# Patient Record
Sex: Female | Born: 1975 | Race: Asian | Hispanic: No | Marital: Married | Smoking: Never smoker
Health system: Southern US, Community
[De-identification: ages and names within clinical notes are randomized; demographics above are authoritative.]

## PROBLEM LIST (undated history)

## (undated) DIAGNOSIS — L709 Acne, unspecified: Secondary | ICD-10-CM

## (undated) DIAGNOSIS — Z789 Other specified health status: Secondary | ICD-10-CM

## (undated) HISTORY — PX: NO PAST SURGERIES: SHX2092

---

## 1998-12-05 ENCOUNTER — Other Ambulatory Visit: Admission: RE | Admit: 1998-12-05 | Discharge: 1998-12-05 | Payer: Self-pay | Admitting: Obstetrics and Gynecology

## 2000-08-19 ENCOUNTER — Ambulatory Visit (HOSPITAL_BASED_OUTPATIENT_CLINIC_OR_DEPARTMENT_OTHER): Admission: RE | Admit: 2000-08-19 | Discharge: 2000-08-19 | Payer: Self-pay | Admitting: Plastic Surgery

## 2001-02-11 ENCOUNTER — Other Ambulatory Visit: Admission: RE | Admit: 2001-02-11 | Discharge: 2001-02-11 | Payer: Self-pay | Admitting: Obstetrics and Gynecology

## 2001-04-17 ENCOUNTER — Inpatient Hospital Stay (HOSPITAL_COMMUNITY): Admission: AD | Admit: 2001-04-17 | Discharge: 2001-04-17 | Payer: Self-pay | Admitting: Obstetrics and Gynecology

## 2001-08-21 ENCOUNTER — Inpatient Hospital Stay (HOSPITAL_COMMUNITY): Admission: AD | Admit: 2001-08-21 | Discharge: 2001-08-21 | Payer: Self-pay | Admitting: Obstetrics and Gynecology

## 2001-08-22 ENCOUNTER — Inpatient Hospital Stay (HOSPITAL_COMMUNITY): Admission: AD | Admit: 2001-08-22 | Discharge: 2001-08-25 | Payer: Self-pay | Admitting: Obstetrics and Gynecology

## 2001-10-02 ENCOUNTER — Other Ambulatory Visit: Admission: RE | Admit: 2001-10-02 | Discharge: 2001-10-02 | Payer: Self-pay | Admitting: Obstetrics and Gynecology

## 2002-10-13 ENCOUNTER — Other Ambulatory Visit: Admission: RE | Admit: 2002-10-13 | Discharge: 2002-10-13 | Payer: Self-pay | Admitting: Obstetrics and Gynecology

## 2003-11-21 ENCOUNTER — Other Ambulatory Visit: Admission: RE | Admit: 2003-11-21 | Discharge: 2003-11-21 | Payer: Self-pay | Admitting: Obstetrics and Gynecology

## 2014-11-15 NOTE — H&P (Signed)
Cathy Yoder  DICTATION # [id number] CSN# [id number]   Margarette Asal, MD 11/15/2014 9:16 AM

## 2014-11-16 NOTE — H&P (Signed)
Cathy Yoder, Cathy Yoder NO.:  [id number]  MEDICAL RECORD NO.:  [id number]  LOCATION:                                FACILITY:  [facility]  PHYSICIAN:  Ralene Bathe. Matthew Saras, M.D.DATE OF BIRTH:  14-May-1975  DATE OF ADMISSION:  12/08/2014 DATE OF DISCHARGE:                             HISTORY & PHYSICAL   CHIEF COMPLAINT:  Symptomatic leiomyoma.  HPI:  A 39 year old, G3, P2.  Her husband has had a vasectomy, presents at this time for TAH bilateral salpingectomy for symptomatic fibroids. Over the last 6 months, she has had worsening problems related to pressure, menorrhagia, and lower back pain.  Ultrasound performed on July 16, demonstrated a large posterior fibroid 9 x 7 cm.  Due to the progressive nature of her symptoms, she prefers to proceed with definitive treatment in the form of TAH bilateral salpingectomy.  This procedure including specific risks related to bleeding, infection, adjacent organ injury, transfusion, wound infection, phlebitis, along with her expected recovery time reviewed.  The rationale for bilateral salpingectomy to reduce later life risks of ovarian cancer discussed.  PAST MEDICAL HISTORY:  Allergies:  AMPICILLIN.  FAMILY HISTORY:  Significant for thyroid disease, otherwise negative. She has had 2 vaginal deliveries.  SOCIAL HISTORY:  Denies alcohol, tobacco, or drug use.  She is married. Her last Pap, dated August 16,  was negative.  PHYSICAL EXAM:  VITAL SIGNS:  Temp 98.2, blood pressure 120/72. HEENT:  Unremarkable. NECK:  Supple without masses. LUNGS:  Clear. CARDIOVASCULAR:  Regular rate and rhythm without murmurs, rubs, gallops. BREASTS:  Without masses. ABDOMEN:  Soft, flat, nontender.  Vulva, vagina, cervix normal.  Uterus 12-14 week size.  Adnexa unremarkable. EXTREMITIES:  Unremarkable. NEUROLOGIC:  Unremarkable.  IMPRESSION:  Symptomatic leiomyoma.  PLAN:  TAH bilateral salpingectomy.  Procedure and risks reviewed  as above.     Kamen Hanken M. Matthew Saras, M.D.     [facility]/MEDQ  D:  11/15/2014  T:  11/16/2014  Job:  [id number]

## 2014-11-28 NOTE — Patient Instructions (Addendum)
Your procedure is scheduled on:12/08/14  Enter through the Main Entrance at :[facility] up desk phone and dial [phone] and inform us of your arrival.  Please call [phone] if you have any problems the morning of surgery.  Remember: Do not eat food or drink liquids, including water, after midnight:Wed.    You may brush your teeth the morning of surgery  DO NOT wear jewelry, eye make-up, lipstick,body lotion, or dark fingernail polish.  (Polished toes are ok) You may wear deodorant.  If you are to be admitted after surgery, leave suitcase in car until your room has been assigned. Patients discharged on the day of surgery will not be allowed to drive home. Wear loose fitting, comfortable clothes for your ride home.

## 2014-11-29 ENCOUNTER — Encounter (HOSPITAL_COMMUNITY): Payer: Self-pay

## 2014-11-29 ENCOUNTER — Encounter (HOSPITAL_COMMUNITY)
Admission: RE | Admit: 2014-11-29 | Discharge: 2014-11-29 | Disposition: A | Discharge status: Home / Self Care | Payer: 59 | Source: Ambulatory Visit | Attending: Obstetrics and Gynecology | Admitting: Obstetrics and Gynecology

## 2014-11-29 DIAGNOSIS — N946 Dysmenorrhea, unspecified: Secondary | ICD-10-CM | POA: Insufficient documentation

## 2014-11-29 DIAGNOSIS — Z01818 Encounter for other preprocedural examination: Secondary | ICD-10-CM | POA: Insufficient documentation

## 2014-11-29 DIAGNOSIS — D259 Leiomyoma of uterus, unspecified: Secondary | ICD-10-CM | POA: Insufficient documentation

## 2014-11-29 DIAGNOSIS — N920 Excessive and frequent menstruation with regular cycle: Secondary | ICD-10-CM | POA: Insufficient documentation

## 2014-11-29 HISTORY — DX: Other specified health status: Z78.9

## 2014-11-29 LAB — CBC
HEMATOCRIT: 36.3 % (ref 36.0–46.0)
Hemoglobin: 11.9 g/dL — ABNORMAL LOW (ref 12.0–15.0)
MCH: 30.6 pg (ref 26.0–34.0)
MCHC: 32.8 g/dL (ref 30.0–36.0)
MCV: 93.3 fL (ref 78.0–100.0)
PLATELETS: 218 10*3/uL (ref 150–400)
RBC: 3.89 MIL/uL (ref 3.87–5.11)
RDW: 13.4 % (ref 11.5–15.5)
WBC: 5.8 10*3/uL (ref 4.0–10.5)

## 2014-11-29 LAB — ABO/RH: ABO/RH(D): AB POS

## 2014-11-29 LAB — TYPE AND SCREEN
ABO/RH(D): AB POS
ANTIBODY SCREEN: NEGATIVE

## 2014-12-07 NOTE — Anesthesia Preprocedure Evaluation (Addendum)
Anesthesia Evaluation  Patient identified by MRN, date of birth, ID band Patient awake    Reviewed: Allergy & Precautions, NPO status , Patient's Chart, lab work & pertinent test results  Airway Mallampati: I  TM Distance: >3 FB Neck ROM: Full    Dental no notable dental hx. (+) Teeth Intact   Pulmonary neg pulmonary ROS,    Pulmonary exam normal breath sounds clear to auscultation       Cardiovascular negative cardio ROS Normal cardiovascular exam Rhythm:Regular Rate:Normal     Neuro/Psych negative neurological ROS  negative psych ROS   GI/Hepatic negative GI ROS, Neg liver ROS,   Endo/Other  negative endocrine ROS  Renal/GU negative Renal ROS  negative genitourinary   Musculoskeletal negative musculoskeletal ROS (+)   Abdominal   Peds  Hematology negative hematology ROS (+)   Anesthesia Other Findings   Reproductive/Obstetrics Uterine fibroids Menorrhagia Dysmenorrhea                             Anesthesia Physical Anesthesia Plan  ASA: I  Anesthesia Plan: General   Post-op Pain Management:    Induction: Intravenous  Airway Management Planned: Oral ETT  Additional Equipment:   Intra-op Plan:   Post-operative Plan: Extubation in OR  Informed Consent: I have reviewed the patients History and Physical, chart, labs and discussed the procedure including the risks, benefits and alternatives for the proposed anesthesia with the patient or authorized representative who has indicated his/her understanding and acceptance.   Dental advisory given  Plan Discussed with: CRNA, Anesthesiologist and Surgeon  Anesthesia Plan Comments:        Anesthesia Quick Evaluation

## 2014-12-08 ENCOUNTER — Inpatient Hospital Stay (HOSPITAL_COMMUNITY): Payer: 59 | Admitting: Anesthesiology

## 2014-12-08 ENCOUNTER — Encounter (HOSPITAL_COMMUNITY)
Admission: RE | Disposition: A | Discharge status: Home / Self Care | Payer: Self-pay | Source: Ambulatory Visit | Attending: Obstetrics and Gynecology

## 2014-12-08 ENCOUNTER — Inpatient Hospital Stay (HOSPITAL_COMMUNITY)
Admission: RE | Admit: 2014-12-08 | Discharge: 2014-12-09 | DRG: 743 | Disposition: A | Discharge status: Home / Self Care | Payer: 59 | Source: Ambulatory Visit | Attending: Obstetrics and Gynecology | Admitting: Obstetrics and Gynecology

## 2014-12-08 ENCOUNTER — Encounter (HOSPITAL_COMMUNITY): Payer: Self-pay | Admitting: *Deleted

## 2014-12-08 DIAGNOSIS — Z90710 Acquired absence of both cervix and uterus: Secondary | ICD-10-CM | POA: Diagnosis present

## 2014-12-08 DIAGNOSIS — N920 Excessive and frequent menstruation with regular cycle: Secondary | ICD-10-CM | POA: Diagnosis present

## 2014-12-08 DIAGNOSIS — M545 Low back pain: Secondary | ICD-10-CM | POA: Diagnosis present

## 2014-12-08 DIAGNOSIS — R102 Pelvic and perineal pain: Secondary | ICD-10-CM | POA: Diagnosis present

## 2014-12-08 DIAGNOSIS — N946 Dysmenorrhea, unspecified: Secondary | ICD-10-CM | POA: Diagnosis present

## 2014-12-08 DIAGNOSIS — D219 Benign neoplasm of connective and other soft tissue, unspecified: Secondary | ICD-10-CM | POA: Diagnosis present

## 2014-12-08 DIAGNOSIS — D259 Leiomyoma of uterus, unspecified: Secondary | ICD-10-CM | POA: Diagnosis present

## 2014-12-08 HISTORY — PX: BILATERAL SALPINGECTOMY: SHX5743

## 2014-12-08 HISTORY — PX: ABDOMINAL HYSTERECTOMY: SHX81

## 2014-12-08 LAB — PREGNANCY, URINE: PREG TEST UR: NEGATIVE

## 2014-12-08 SURGERY — HYSTERECTOMY, ABDOMINAL
Anesthesia: General

## 2014-12-08 MED ORDER — SCOPOLAMINE 1 MG/3DAYS TD PT72
MEDICATED_PATCH | TRANSDERMAL | Status: AC
  Administered 2014-12-08: 1.5 mg via TRANSDERMAL
  Filled 2014-12-08: qty 1

## 2014-12-08 MED ORDER — PHENYLEPHRINE HCL 10 MG/ML IJ SOLN
INTRAMUSCULAR | Status: DC | PRN
  Administered 2014-12-08 (×3): 40 ug via INTRAVENOUS

## 2014-12-08 MED ORDER — SODIUM CHLORIDE 0.9 % IJ SOLN
INTRAMUSCULAR | Status: AC
  Filled 2014-12-08: qty 20

## 2014-12-08 MED ORDER — MEPERIDINE HCL 25 MG/ML IJ SOLN: 6.2500 mg | INTRAMUSCULAR | Status: DC | PRN

## 2014-12-08 MED ORDER — ROCURONIUM BROMIDE 100 MG/10ML IV SOLN
INTRAVENOUS | Status: DC | PRN
  Administered 2014-12-08: 45 mg via INTRAVENOUS
  Administered 2014-12-08: 5 mg via INTRAVENOUS

## 2014-12-08 MED ORDER — ONDANSETRON HCL 4 MG/2ML IJ SOLN
INTRAMUSCULAR | Status: DC | PRN
  Administered 2014-12-08: 4 mg via INTRAVENOUS

## 2014-12-08 MED ORDER — KETOROLAC TROMETHAMINE 30 MG/ML IJ SOLN: 30.0000 mg | Freq: Once | INTRAMUSCULAR | Status: DC

## 2014-12-08 MED ORDER — IBUPROFEN 800 MG PO TABS
800.0000 mg | ORAL_TABLET | Freq: Three times a day (TID) | ORAL | Status: DC | PRN
  Administered 2014-12-09: 800 mg via ORAL
  Filled 2014-12-08: qty 1

## 2014-12-08 MED ORDER — SCOPOLAMINE 1 MG/3DAYS TD PT72
1.0000 | MEDICATED_PATCH | Freq: Once | TRANSDERMAL | Status: DC
  Administered 2014-12-08: 1.5 mg via TRANSDERMAL

## 2014-12-08 MED ORDER — KETOROLAC TROMETHAMINE 30 MG/ML IJ SOLN
30.0000 mg | Freq: Four times a day (QID) | INTRAMUSCULAR | Status: DC
  Administered 2014-12-08 – 2014-12-09 (×3): 30 mg via INTRAVENOUS
  Filled 2014-12-08 (×3): qty 1

## 2014-12-08 MED ORDER — NEOSTIGMINE METHYLSULFATE 10 MG/10ML IV SOLN
INTRAVENOUS | Status: AC
  Filled 2014-12-08: qty 1

## 2014-12-08 MED ORDER — KETOROLAC TROMETHAMINE 30 MG/ML IJ SOLN: 30.0000 mg | Freq: Four times a day (QID) | INTRAMUSCULAR | Status: DC

## 2014-12-08 MED ORDER — OXYCODONE-ACETAMINOPHEN 5-325 MG PO TABS
1.0000 | ORAL_TABLET | ORAL | Status: DC | PRN
  Administered 2014-12-09: 1 via ORAL
  Filled 2014-12-08: qty 1

## 2014-12-08 MED ORDER — NEOSTIGMINE METHYLSULFATE 10 MG/10ML IV SOLN
INTRAVENOUS | Status: DC | PRN
  Administered 2014-12-08: 2 mg via INTRAVENOUS

## 2014-12-08 MED ORDER — MENTHOL 3 MG MT LOZG: 1.0000 | LOZENGE | OROMUCOSAL | Status: DC | PRN

## 2014-12-08 MED ORDER — ONDANSETRON HCL 4 MG/2ML IJ SOLN
4.0000 mg | Freq: Four times a day (QID) | INTRAMUSCULAR | Status: DC | PRN
  Administered 2014-12-08: 4 mg via INTRAVENOUS

## 2014-12-08 MED ORDER — FENTANYL CITRATE (PF) 250 MCG/5ML IJ SOLN
INTRAMUSCULAR | Status: AC
  Filled 2014-12-08: qty 25

## 2014-12-08 MED ORDER — ONDANSETRON HCL 4 MG PO TABS: 4.0000 mg | ORAL_TABLET | Freq: Four times a day (QID) | ORAL | Status: DC | PRN

## 2014-12-08 MED ORDER — GLYCOPYRROLATE 0.2 MG/ML IJ SOLN
INTRAMUSCULAR | Status: DC | PRN
  Administered 2014-12-08: 0.4 mg via INTRAVENOUS

## 2014-12-08 MED ORDER — MIDAZOLAM HCL 2 MG/2ML IJ SOLN
INTRAMUSCULAR | Status: DC | PRN
  Administered 2014-12-08: 2 mg via INTRAVENOUS

## 2014-12-08 MED ORDER — PROPOFOL 10 MG/ML IV BOLUS
INTRAVENOUS | Status: AC
  Filled 2014-12-08: qty 20

## 2014-12-08 MED ORDER — DEXAMETHASONE SODIUM PHOSPHATE 10 MG/ML IJ SOLN
INTRAMUSCULAR | Status: AC
  Filled 2014-12-08: qty 1

## 2014-12-08 MED ORDER — GLYCOPYRROLATE 0.2 MG/ML IJ SOLN
INTRAMUSCULAR | Status: AC
  Filled 2014-12-08: qty 3

## 2014-12-08 MED ORDER — HYDROMORPHONE HCL 1 MG/ML IJ SOLN
INTRAMUSCULAR | Status: AC
  Filled 2014-12-08: qty 1

## 2014-12-08 MED ORDER — DEXTROSE IN LACTATED RINGERS 5 % IV SOLN
INTRAVENOUS | Status: DC
  Administered 2014-12-08: 17:00:00 via INTRAVENOUS

## 2014-12-08 MED ORDER — BUPIVACAINE LIPOSOME 1.3 % IJ SUSP
20.0000 mL | Freq: Once | INTRAMUSCULAR | Status: AC
  Administered 2014-12-08: 40 mL
  Filled 2014-12-08: qty 20

## 2014-12-08 MED ORDER — NALOXONE HCL 0.4 MG/ML IJ SOLN: 0.4000 mg | INTRAMUSCULAR | Status: DC | PRN

## 2014-12-08 MED ORDER — FENTANYL CITRATE (PF) 100 MCG/2ML IJ SOLN
INTRAMUSCULAR | Status: DC | PRN
  Administered 2014-12-08: 100 ug via INTRAVENOUS
  Administered 2014-12-08 (×3): 50 ug via INTRAVENOUS

## 2014-12-08 MED ORDER — LACTATED RINGERS IV SOLN
INTRAVENOUS | Status: DC
  Administered 2014-12-08 (×4): via INTRAVENOUS

## 2014-12-08 MED ORDER — MIDAZOLAM HCL 2 MG/2ML IJ SOLN
INTRAMUSCULAR | Status: AC
  Filled 2014-12-08: qty 4

## 2014-12-08 MED ORDER — LIDOCAINE HCL (CARDIAC) 20 MG/ML IV SOLN
INTRAVENOUS | Status: DC | PRN
  Administered 2014-12-08: 80 mg via INTRAVENOUS

## 2014-12-08 MED ORDER — ROCURONIUM BROMIDE 100 MG/10ML IV SOLN
INTRAVENOUS | Status: AC
  Filled 2014-12-08: qty 1

## 2014-12-08 MED ORDER — HYDROMORPHONE HCL 1 MG/ML IJ SOLN
0.2500 mg | INTRAMUSCULAR | Status: DC | PRN
  Administered 2014-12-08 (×2): 0.5 mg via INTRAVENOUS

## 2014-12-08 MED ORDER — HYDROMORPHONE HCL 1 MG/ML IJ SOLN
INTRAMUSCULAR | Status: DC | PRN
  Administered 2014-12-08 (×2): 0.5 mg via INTRAVENOUS

## 2014-12-08 MED ORDER — BUTORPHANOL TARTRATE 1 MG/ML IJ SOLN: 1.0000 mg | INTRAMUSCULAR | Status: DC | PRN

## 2014-12-08 MED ORDER — PROPOFOL 10 MG/ML IV BOLUS
INTRAVENOUS | Status: DC | PRN
  Administered 2014-12-08: 160 mg via INTRAVENOUS

## 2014-12-08 MED ORDER — INFLUENZA VAC SPLIT QUAD 0.5 ML IM SUSY
0.5000 mL | PREFILLED_SYRINGE | INTRAMUSCULAR | Status: AC
  Administered 2014-12-09: 0.5 mL via INTRAMUSCULAR
  Filled 2014-12-08: qty 0.5

## 2014-12-08 MED ORDER — KETOROLAC TROMETHAMINE 30 MG/ML IJ SOLN
INTRAMUSCULAR | Status: DC | PRN
  Administered 2014-12-08: 30 mg via INTRAVENOUS

## 2014-12-08 MED ORDER — ONDANSETRON HCL 4 MG/2ML IJ SOLN
4.0000 mg | Freq: Four times a day (QID) | INTRAMUSCULAR | Status: DC | PRN
  Filled 2014-12-08: qty 2

## 2014-12-08 MED ORDER — ONDANSETRON HCL 4 MG/2ML IJ SOLN
INTRAMUSCULAR | Status: AC
  Filled 2014-12-08: qty 2

## 2014-12-08 MED ORDER — LIDOCAINE HCL (CARDIAC) 20 MG/ML IV SOLN
INTRAVENOUS | Status: AC
  Filled 2014-12-08: qty 5

## 2014-12-08 MED ORDER — PHENYLEPHRINE HCL 10 MG/ML IJ SOLN: INTRAMUSCULAR | Status: DC | PRN

## 2014-12-08 MED ORDER — METOCLOPRAMIDE HCL 5 MG/ML IJ SOLN: 10.0000 mg | Freq: Once | INTRAMUSCULAR | Status: DC | PRN

## 2014-12-08 MED ORDER — CEFAZOLIN SODIUM-DEXTROSE 2-3 GM-% IV SOLR
INTRAVENOUS | Status: AC
  Filled 2014-12-08: qty 50

## 2014-12-08 MED ORDER — CEFAZOLIN SODIUM-DEXTROSE 2-3 GM-% IV SOLR
2.0000 g | Freq: Once | INTRAVENOUS | Status: AC
  Administered 2014-12-08: 2 g via INTRAVENOUS

## 2014-12-08 MED ORDER — SODIUM CHLORIDE 0.9 % IJ SOLN: 9.0000 mL | INTRAMUSCULAR | Status: DC | PRN

## 2014-12-08 MED ORDER — DEXTROSE 5 % IV SOLN
INTRAVENOUS | Status: DC
  Filled 2014-12-08: qty 8.5

## 2014-12-08 MED ORDER — MORPHINE SULFATE 1 MG/ML IV SOLN
INTRAVENOUS | Status: DC
  Administered 2014-12-08: 11:00:00 via INTRAVENOUS
  Administered 2014-12-08: 2 mg via INTRAVENOUS
  Administered 2014-12-08 (×2): 5 mg via INTRAVENOUS
  Administered 2014-12-09: 1 mg via INTRAVENOUS
  Filled 2014-12-08: qty 25

## 2014-12-08 MED ORDER — DIPHENHYDRAMINE HCL 50 MG/ML IJ SOLN: 12.5000 mg | Freq: Four times a day (QID) | INTRAMUSCULAR | Status: DC | PRN

## 2014-12-08 MED ORDER — DIPHENHYDRAMINE HCL 12.5 MG/5ML PO ELIX: 12.5000 mg | ORAL_SOLUTION | Freq: Four times a day (QID) | ORAL | Status: DC | PRN

## 2014-12-08 MED ORDER — DEXAMETHASONE SODIUM PHOSPHATE 10 MG/ML IJ SOLN
INTRAMUSCULAR | Status: DC | PRN
  Administered 2014-12-08: 4 mg via INTRAVENOUS

## 2014-12-08 SURGICAL SUPPLY — 43 items
BARRIER ADHS 3X4 INTERCEED (GAUZE/BANDAGES/DRESSINGS) IMPLANT
BENZOIN TINCTURE PRP APPL 2/3 (GAUZE/BANDAGES/DRESSINGS) ×4 IMPLANT
CANISTER SUCT 3000ML (MISCELLANEOUS) ×4 IMPLANT
CLOSURE WOUND 1/2 X4 (GAUZE/BANDAGES/DRESSINGS) ×1
CLOTH BEACON ORANGE TIMEOUT ST (SAFETY) ×4 IMPLANT
CONT PATH 16OZ SNAP LID 3702 (MISCELLANEOUS) ×4 IMPLANT
DECANTER SPIKE VIAL GLASS SM (MISCELLANEOUS) ×8 IMPLANT
DRAPE CESAREAN BIRTH W POUCH (DRAPES) ×4 IMPLANT
DRAPE WARM FLUID 44X44 (DRAPE) ×4 IMPLANT
DRSG OPSITE POSTOP 4X10 (GAUZE/BANDAGES/DRESSINGS) ×4 IMPLANT
DURAPREP 26ML APPLICATOR (WOUND CARE) ×4 IMPLANT
ELECT LIGASURE SHORT 9 REUSE (ELECTRODE) ×4 IMPLANT
GLOVE BIO SURGEON STRL SZ7 (GLOVE) ×12 IMPLANT
GLOVE BIOGEL PI IND STRL 7.0 (GLOVE) ×6 IMPLANT
GLOVE BIOGEL PI INDICATOR 7.0 (GLOVE) ×6
GOWN STRL REUS W/TWL LRG LVL3 (GOWN DISPOSABLE) ×12 IMPLANT
NEEDLE HYPO 18GX1.5 BLUNT FILL (NEEDLE) IMPLANT
NEEDLE HYPO 22GX1.5 SAFETY (NEEDLE) ×8 IMPLANT
NEEDLE HYPO 25X1 1.5 SAFETY (NEEDLE) ×4 IMPLANT
NS IRRIG 1000ML POUR BTL (IV SOLUTION) ×4 IMPLANT
PACK ABDOMINAL GYN (CUSTOM PROCEDURE TRAY) ×4 IMPLANT
PAD OB MATERNITY 4.3X12.25 (PERSONAL CARE ITEMS) ×4 IMPLANT
PENCIL SMOKE EVAC W/HOLSTER (ELECTROSURGICAL) ×4 IMPLANT
PROTECTOR NERVE ULNAR (MISCELLANEOUS) ×4 IMPLANT
SPONGE LAP 18X18 X RAY DECT (DISPOSABLE) ×8 IMPLANT
STRIP CLOSURE SKIN 1/2X4 (GAUZE/BANDAGES/DRESSINGS) ×3 IMPLANT
SUT CHROMIC 3 0 SH 27 (SUTURE) IMPLANT
SUT MON AB 2-0 CT1 36 (SUTURE) ×4 IMPLANT
SUT MON AB 4-0 PS1 27 (SUTURE) ×4 IMPLANT
SUT PDS AB 0 CT1 27 (SUTURE) ×8 IMPLANT
SUT VIC AB 0 CT1 18XCR BRD8 (SUTURE) ×6 IMPLANT
SUT VIC AB 0 CT1 27 (SUTURE) ×2
SUT VIC AB 0 CT1 27XBRD ANBCTR (SUTURE) ×2 IMPLANT
SUT VIC AB 0 CT1 8-18 (SUTURE) ×6
SUT VIC AB 2-0 CT1 27 (SUTURE)
SUT VIC AB 2-0 CT1 TAPERPNT 27 (SUTURE) IMPLANT
SUT VIC AB 3-0 CT1 27 (SUTURE) ×2
SUT VIC AB 3-0 CT1 TAPERPNT 27 (SUTURE) ×2 IMPLANT
SUT VICRYL 0 TIES 12 18 (SUTURE) ×4 IMPLANT
SYR 20CC LL (SYRINGE) ×8 IMPLANT
TOWEL OR 17X24 6PK STRL BLUE (TOWEL DISPOSABLE) ×8 IMPLANT
TRAY FOLEY CATH SILVER 14FR (SET/KITS/TRAYS/PACK) ×4 IMPLANT
WATER STERILE IRR 1000ML POUR (IV SOLUTION) ×4 IMPLANT

## 2014-12-08 NOTE — Addendum Note (Signed)
Addendum  created 12/08/14 1325 by Raenette Rover, CRNA   Modules edited: Notes Section   Notes Section:  File: [id number]

## 2014-12-08 NOTE — Progress Notes (Signed)
The patient was re-examined with no change in status 

## 2014-12-08 NOTE — Anesthesia Procedure Notes (Signed)
Procedure Name: Intubation Date/Time: 12/08/2014 7:28 AM Performed by: Flossie Dibble Pre-anesthesia Checklist: Suction available, Emergency Drugs available, Timeout performed, Patient identified and Patient being monitored Patient Re-evaluated:Patient Re-evaluated prior to inductionOxygen Delivery Method: Circle system utilized Preoxygenation: Pre-oxygenation with 100% oxygen Intubation Type: IV induction Ventilation: Mask ventilation without difficulty Laryngoscope Size: Mac and 3 Grade View: Grade I Tube type: Oral Tube size: 7.0 mm Number of attempts: 1 Airway Equipment and Method: Stylet Placement Confirmation: ETT inserted through vocal cords under direct vision,  positive ETCO2 and breath sounds checked- equal and bilateral Secured at: 21 cm Tube secured with: Tape Dental Injury: Teeth and Oropharynx as per pre-operative assessment

## 2014-12-08 NOTE — Anesthesia Postprocedure Evaluation (Signed)
  Anesthesia Post-op Note  Patient: Cathy Yoder  Procedure(s) Performed: Procedure(s): HYSTERECTOMY ABDOMINAL (N/A) BILATERAL SALPINGECTOMY (Bilateral)  Patient Location: PACU  Anesthesia Type:General  Level of Consciousness: awake, alert  and oriented  Airway and Oxygen Therapy: Patient Spontanous Breathing and Patient connected to nasal cannula oxygen  Post-op Pain: mild  Post-op Assessment: Post-op Vital signs reviewed, Patient's Cardiovascular Status Stable, Respiratory Function Stable, Patent Airway, No signs of Nausea or vomiting, Pain level controlled and No headache              Post-op Vital Signs: Reviewed and stable  Last Vitals:  Filed Vitals:   12/08/14 0850  BP: 101/54  Pulse: 64  Temp: 36.9 C  Resp: 10    Complications: No apparent anesthesia complications

## 2014-12-08 NOTE — Op Note (Signed)
Preoperative diagnosis: Symptomatic leiomyoma  Postoperative diagnosis: Same  Procedure: TAH, bilateral salpingectomy  Surgeon: Matthew Saras  Asst.: McComb  EBL: 1 50 cc  Specimens removed: Uterus, bilateral tubes, to pathology  Drains: Foley, to straight drain  Procedure and findings:  The patient was taken the operating room after an adequate level of general anesthesia was obtained with the patient supine the abdomen prepped and draped in the usual fashion, vagina was prepped separately and Foley catheter positioned. Appropriate timeouts taken at that point. Transverse incision made 2 finger breaths above the symphysis carried down to the fascia which was incised and extended transversely. Rectus muscles divided in the midline, peritoneum entered superiorly without incident and extended vertically. The uterus is enlarged approximately 12 weeks size was able to be delivered through the incision without a retractor. Long Kelly clamps were then placed at each utero-ovarian pedicle bilateral tubes and ovaries were normal.  Starting on the left the round ligament was clamped divided and suture ligated with oh Vicryls peritoneum carried around to the midline posterior leaf of the broad ligament was perforated with the surgeon's finger and the utero-ovarian pedicle was isolated clamped divided first free tie followed by suture ligature oh Vicryls thus conserving the ovary the exact same repeated on the opposite side once the peritoneum was advanced medially, the bladder flap was created with sharp and blunt dissection assuring that the bladder was well below the cervical vaginal junction. Uterine vasculature pedicles on either side were then skeletonized, using clamp cut and tie technique the descending branch of the uterine artery, cardinal ligament uterosacral ligament and cervical vaginal pedicles were clamped divided and suture ligated. The specimen was excised vaginal cuff closed with interrupted  figure-of-eight 0 Vicryls sutures. The pelvis is in irrigated with saline and noted be hemostatic. The LigaSure device was then used to perform bilateral salpingectomy removing both tubes but conserving both ovaries.  Careful inspection the operative site revealed to be hemostatic. Sponge, needle, instrument counts reported as correct 2 peritoneum was enclose with a 30 Vicryls running suture. The same to reapproximate rectus muscles in the midline. 0 PDS was then used from laterally to midline on either side to close the fascia subcutaneous dictated tissue was hemostatic. 50% diluted Exparel solution was then used to inject subfascially and subcutaneously for postoperative analgesia. 4-0 Monocryl subcuticular closure with Steri-Strips. She tolerated this well went to recovery room in good condition.  Dictated with OrwellD.

## 2014-12-08 NOTE — Transfer of Care (Signed)
Immediate Anesthesia Transfer of Care Note  Patient: Cathy Yoder  Procedure(s) Performed: Procedure(s): HYSTERECTOMY ABDOMINAL (N/A) BILATERAL SALPINGECTOMY (Bilateral)  Patient Location: PACU  Anesthesia Type:General  Level of Consciousness: awake, alert  and oriented  Airway & Oxygen Therapy: Patient Spontanous Breathing and Patient connected to nasal cannula oxygen  Post-op Assessment: Report given to RN and Post -op Vital signs reviewed and stable  Post vital signs: Reviewed and stable  Last Vitals:  Filed Vitals:   12/08/14 0612  BP: 114/81  Pulse: 71  Temp: 36.8 C  Resp: 18    Complications: No apparent anesthesia complications

## 2014-12-08 NOTE — Anesthesia Postprocedure Evaluation (Signed)
  Anesthesia Post-op Note  Patient: Cathy Yoder  Procedure(s) Performed: Procedure(s): HYSTERECTOMY ABDOMINAL (N/A) BILATERAL SALPINGECTOMY (Bilateral)  Patient Location: [facility]  Anesthesia Type:General  Level of Consciousness: awake, alert , oriented and patient cooperative  Airway and Oxygen Therapy: Patient Spontanous Breathing and Patient connected to nasal cannula oxygen  Post-op Pain: none  Post-op Assessment: Post-op Vital signs reviewed, Patient's Cardiovascular Status Stable, Respiratory Function Stable, Patent Airway and No signs of Nausea or vomiting              Post-op Vital Signs: Reviewed and stable  Last Vitals:  Filed Vitals:   12/08/14 1200  BP: 108/76  Pulse: 86  Temp: 37.1 C  Resp: 12    Complications: No apparent anesthesia complications

## 2014-12-09 ENCOUNTER — Encounter (HOSPITAL_COMMUNITY): Payer: Self-pay | Admitting: Obstetrics and Gynecology

## 2014-12-09 LAB — CBC
HEMATOCRIT: 27.8 % — AB (ref 36.0–46.0)
HEMOGLOBIN: 9.3 g/dL — AB (ref 12.0–15.0)
MCH: 30.5 pg (ref 26.0–34.0)
MCHC: 33.5 g/dL (ref 30.0–36.0)
MCV: 91.1 fL (ref 78.0–100.0)
Platelets: 185 10*3/uL (ref 150–400)
RBC: 3.05 MIL/uL — AB (ref 3.87–5.11)
RDW: 13.1 % (ref 11.5–15.5)
WBC: 6.7 10*3/uL (ref 4.0–10.5)

## 2014-12-09 MED ORDER — IBUPROFEN 800 MG PO TABS: 800.0000 mg | ORAL_TABLET | Freq: Three times a day (TID) | ORAL | Status: DC | PRN

## 2014-12-09 MED ORDER — OXYCODONE-ACETAMINOPHEN 5-325 MG PO TABS: 1.0000 | ORAL_TABLET | ORAL | Status: DC | PRN

## 2014-12-09 NOTE — Progress Notes (Signed)
Patient discharged home with mother... Discharge instructions reviewed with patient and she verbalized understanding... Honeycomb removed prior to discharge, and steri strips left in place... Condition stable... No equipment... Taken to car via wheelchair by Janalyn Shy, RN.

## 2014-12-09 NOTE — Discharge Summary (Signed)
Physician Discharge Summary  Patient ID: Cathy Yoder MRN: [id number] DOB/AGE: 41-Dec-1977 41 y.o.  Admit date: 12/08/2014 Discharge date: 12/09/2014  Admission Diagnoses:leiomyoma/menorrhagia/pelvic pain  Discharge Diagnoses: same Active Problems:   Leiomyoma   H/O abdominal hysterectomy   Discharged Condition: good  Hospital Course: adm for TAH + bilat salpingectomy, on POD 1, afeb, tol PO, rerady for D/C  Consults: None  Significant Diagnostic Studies: labs:  CBC    Component Value Date/Time   WBC 6.7 12/09/2014 0600   RBC 3.05* 12/09/2014 0600   HGB 9.3* 12/09/2014 0600   HCT 27.8* 12/09/2014 0600   PLT 185 12/09/2014 0600   MCV 91.1 12/09/2014 0600   MCH 30.5 12/09/2014 0600   MCHC 33.5 12/09/2014 0600   RDW 13.1 12/09/2014 0600      Treatments: surgery: TAH bilat salpingectomy  Discharge Exam: Blood pressure 87/51, pulse 60, temperature 98.9 F (37.2 C), temperature source Oral, resp. rate 15, height 5\' 5"  (1.651 m), weight 151 lb (68.493 kg), SpO2 98 %. Incision/Wound:c/d, abd soft + BS  Disposition:      Medication List    TAKE these medications        ibuprofen 800 MG tablet  Commonly known as:  ADVIL,MOTRIN  Take 1 tablet (800 mg total) by mouth every 8 (eight) hours as needed for moderate pain (mild pain).     oxyCODONE-acetaminophen 5-325 MG tablet  Commonly known as:  PERCOCET/ROXICET  Take 1-2 tablets by mouth every 4 (four) hours as needed for severe pain (moderate to severe pain (when tolerating fluids)).           Follow-up Information    Follow up with Margarette Asal, MD.   Specialty:  Obstetrics and Gynecology   Why:  office will call   Contact information:   [address] [place] [address] [phone]       Signed: Margarette Asal 12/09/2014, 8:42 AM

## 2018-08-31 ENCOUNTER — Other Ambulatory Visit: Payer: Self-pay | Admitting: Obstetrics and Gynecology

## 2018-08-31 DIAGNOSIS — N6002 Solitary cyst of left breast: Secondary | ICD-10-CM

## 2018-09-07 ENCOUNTER — Ambulatory Visit
Admission: RE | Admit: 2018-09-07 | Discharge: 2018-09-07 | Disposition: A | Discharge status: Home / Self Care | Payer: 59 | Source: Ambulatory Visit | Attending: Obstetrics and Gynecology | Admitting: Obstetrics and Gynecology

## 2018-09-07 ENCOUNTER — Other Ambulatory Visit: Payer: Self-pay | Admitting: Obstetrics and Gynecology

## 2018-09-07 ENCOUNTER — Other Ambulatory Visit: Payer: Self-pay

## 2018-09-07 DIAGNOSIS — N6002 Solitary cyst of left breast: Secondary | ICD-10-CM

## 2018-09-07 DIAGNOSIS — N6320 Unspecified lump in the left breast, unspecified quadrant: Secondary | ICD-10-CM

## 2018-09-08 ENCOUNTER — Ambulatory Visit
Admission: RE | Admit: 2018-09-08 | Discharge: 2018-09-08 | Disposition: A | Discharge status: Home / Self Care | Payer: 59 | Source: Ambulatory Visit | Attending: Obstetrics and Gynecology | Admitting: Obstetrics and Gynecology

## 2018-09-08 DIAGNOSIS — N6320 Unspecified lump in the left breast, unspecified quadrant: Secondary | ICD-10-CM

## 2018-09-18 ENCOUNTER — Ambulatory Visit: Payer: Self-pay | Admitting: Surgery

## 2018-09-18 DIAGNOSIS — D242 Benign neoplasm of left breast: Secondary | ICD-10-CM

## 2018-09-18 NOTE — H&P (Signed)
Cathy Yoder Documented: 09/18/2018 9:33 AM Location: [facility] Patient #: [id number] DOB: 03/26/1975 Married / Language: English / Race: Asian Female  History of Present Illness Marcello Moores A. Keyshla Tunison MD; 09/18/2018 11:57 AM) Patient words: Patient sent request of Dr.Hu for abnormal mammogram. She had an area in her left upper inner quadrant of her breast core biopsy proven to be a papillary lesion with no atypia. She has no family history. Denies breast pain, nipple discharge or breast mass bilaterally.                  Diagnosis Breast, left, needle core biopsy, 10 o'clock position, upper inner - PAPILLARY LESION. SEE NOTE Diagnosis Note The findings are suggestive of an intraductal papilloma. Immunohistochemical stains for E-cadherin and CK5/6 are negative for presence of neoplasia in the submitted biopsy. Dr. Melina Copa has reviewed the case and concurs with the above interpretation. [facility] was notified on 09/09/2018. Jaquita Folds MD Pathologist, Electronic Signature (Case signed 09/09/2018) Specimen Gross and Clinical Information   CLINICAL DATA: 43 year old female with new palpable LEFT breast lump identified on self-examination. Also for annual bilateral mammogram.  EXAM: DIGITAL DIAGNOSTIC BILATERAL MAMMOGRAM WITH CAD AND TOMO  ULTRASOUND LEFT BREAST  COMPARISON: Previous exam(s).  ACR Breast Density Category c: The breast tissue is heterogeneously dense, which may obscure small masses.  FINDINGS: 2D/3D full field views of both breasts and spot compression view of the LEFT breast demonstrate an obscured mass within the UPPER INNER LEFT breast at the site of palpable concern.  No other mass, distortion or worrisome calcifications are noted within either breast.  Mammographic images were processed with CAD.  On physical exam, a firm palpable mass identified at the 10 o'clock position of the LEFT breast  1 cm from the nipple.  Targeted ultrasound is performed, showing a 1.3 x 0.9 x 1.5 cm partially circumscribed partially indistinct hypoechoic mass at the 10 o'clock position of the LEFT breast 1 cm from the nipple with internal vascular flow. There is equivocal irregularity along the anterior aspect of this mass.  No abnormal LEFT axillary lymph nodes are identified.  IMPRESSION: Indeterminate 1.5 cm UPPER INNER LEFT breast mass. Tissue sampling is recommended.  No abnormal LEFT axillary lymph nodes.  RECOMMENDATION: Ultrasound-guided LEFT breast biopsy, which will be scheduled.  I have discussed the findings and recommendations with the patient. If applicable, a reminder letter will be sent to the patient regarding the next appointment.  BI-RADS CATEGORY 4: Suspicious.   Electronically Signed By: Margarette Canada M.D. On: 09/07/2018 13:38.  The patient is a 43 year old female.   Allergies (Tanisha A. Owens Shark, [place]; 09/18/2018 9:34 AM) No Known Drug Allergies [09/18/2018]: Allergies Reconciled  Medication History (Tanisha A. Owens Shark, [place]; 09/18/2018 9:34 AM) Spironolactone (100MG  Tablet, Oral) Active. Clindamycin Phosphate (1% Lotion, External) Active. Medications Reconciled    Vitals (Tanisha A. Brown RMA; 09/18/2018 9:34 AM) 09/18/2018 9:33 AM Weight: 138.4 lb Height: 64in Body Surface Area: 1.67 m Body Mass Index: 23.76 kg/m  Temp.: 98.42F  Pulse: 95 (Regular)  BP: 124/88 (Sitting, Left Arm, Standard)        Physical Exam (Mikayla Chiusano A. Kirsi Hugh MD; 09/18/2018 11:57 AM)  General Mental Status-Alert. General Appearance-Consistent with stated age. Hydration-Well hydrated. Voice-Normal.  Head and Neck Head-normocephalic, atraumatic with no lesions or palpable masses. Trachea-midline. Thyroid Gland Characteristics - normal size and consistency.  Eye Eyeball - Bilateral-Extraocular movements intact. Sclera/Conjunctiva -  Bilateral-No scleral icterus.  Chest and Lung Exam  Chest and lung exam reveals -quiet, even and easy respiratory effort with no use of accessory muscles and on auscultation, normal breath sounds, no adventitious sounds and normal vocal resonance. Inspection Chest Wall - Normal. Back - normal.  Breast Breast - Left-Symmetric, Non Tender, No Biopsy scars, no Dimpling - Left, No Inflammation, No Lumpectomy scars, No Mastectomy scars, No Peau d' Orange. Breast - Right-Symmetric, Non Tender, No Biopsy scars, no Dimpling - Right, No Inflammation, No Lumpectomy scars, No Mastectomy scars, No Peau d' Orange. Breast Lump-No Palpable Breast Mass. Note: Left breast shows bruising in the medial aspect.  Cardiovascular Cardiovascular examination reveals -normal heart sounds, regular rate and rhythm with no murmurs and normal pedal pulses bilaterally.  Neurologic Neurologic evaluation reveals -alert and oriented x 3 with no impairment of recent or remote memory. Mental Status-Normal.  Lymphatic Head & Neck  General Head & Neck Lymphatics: Bilateral - Description - Normal. Axillary  General Axillary Region: Bilateral - Description - Normal. Tenderness - Non Tender.    Assessment & Plan (Pepper Wyndham A. Tarvares Lant MD; 09/18/2018 11:58 AM)  INTRADUCTAL PAPILLOMA OF BREAST, LEFT (D24.2) Impression: Discussed lumpectomy versus observation. Discussed potential upgrade risk of 10%. She is opted for left breast seed Risk of lumpectomy include bleeding, infection, seroma, more surgery, use of seed/wire, wound care, cosmetic deformity and the need for other treatments, death , blood clots, death. Pt agrees to proceed. localized lumpectomy.  Current Plans Pt Education - [facility] Free Text Education/Instructions: discussed with patient and provided information. Pt Education - [facility] Breast Biopsy HCI: discussed with patient and provided information. The anatomy and the physiology was discussed. The  pathophysiology and natural history of the disease was discussed. Options were discussed and recommendations were made. Technique, risks, benefits, & alternatives were discussed. Risks such as stroke, heart attack, bleeding, indection, death, and other risks discussed. Questions answered. The patient agrees to proceed.

## 2018-09-24 ENCOUNTER — Other Ambulatory Visit: Payer: Self-pay | Admitting: Surgery

## 2018-09-24 DIAGNOSIS — D242 Benign neoplasm of left breast: Secondary | ICD-10-CM

## 2018-10-26 ENCOUNTER — Other Ambulatory Visit: Payer: Self-pay

## 2018-10-26 ENCOUNTER — Other Ambulatory Visit (HOSPITAL_COMMUNITY)
Admission: RE | Admit: 2018-10-26 | Discharge: 2018-10-26 | Disposition: A | Discharge status: Home / Self Care | Payer: 59 | Source: Ambulatory Visit | Attending: Surgery | Admitting: Surgery

## 2018-10-26 ENCOUNTER — Encounter (HOSPITAL_BASED_OUTPATIENT_CLINIC_OR_DEPARTMENT_OTHER): Payer: Self-pay | Admitting: *Deleted

## 2018-10-26 DIAGNOSIS — D242 Benign neoplasm of left breast: Secondary | ICD-10-CM | POA: Diagnosis not present

## 2018-10-26 DIAGNOSIS — Z01812 Encounter for preprocedural laboratory examination: Secondary | ICD-10-CM | POA: Diagnosis not present

## 2018-10-26 DIAGNOSIS — Z20828 Contact with and (suspected) exposure to other viral communicable diseases: Secondary | ICD-10-CM | POA: Diagnosis not present

## 2018-10-26 LAB — SARS CORONAVIRUS 2 (TAT 6-24 HRS): SARS Coronavirus 2: NEGATIVE

## 2018-10-27 ENCOUNTER — Encounter (HOSPITAL_BASED_OUTPATIENT_CLINIC_OR_DEPARTMENT_OTHER)
Admission: RE | Admit: 2018-10-27 | Discharge: 2018-10-27 | Disposition: A | Discharge status: Home / Self Care | Payer: 59 | Source: Ambulatory Visit | Attending: Surgery | Admitting: Surgery

## 2018-10-27 DIAGNOSIS — Z01812 Encounter for preprocedural laboratory examination: Secondary | ICD-10-CM | POA: Insufficient documentation

## 2018-10-27 DIAGNOSIS — D242 Benign neoplasm of left breast: Secondary | ICD-10-CM | POA: Diagnosis not present

## 2018-10-27 LAB — COMPREHENSIVE METABOLIC PANEL
ALT: 11 U/L (ref 0–44)
AST: 16 U/L (ref 15–41)
Albumin: 4 g/dL (ref 3.5–5.0)
Alkaline Phosphatase: 39 U/L (ref 38–126)
Anion gap: 8 (ref 5–15)
BUN: 6 mg/dL (ref 6–20)
CO2: 25 mmol/L (ref 22–32)
Calcium: 9.1 mg/dL (ref 8.9–10.3)
Chloride: 105 mmol/L (ref 98–111)
Creatinine, Ser: 0.69 mg/dL (ref 0.44–1.00)
GFR calc Af Amer: >60 mL/min (ref >60)
GFR calc non Af Amer: >60 mL/min (ref >60)
Glucose, Bld: 95 mg/dL (ref 70–99)
Potassium: 3.7 mmol/L (ref 3.5–5.1)
Sodium: 138 mmol/L (ref 135–145)
Total Bilirubin: 0.9 mg/dL (ref 0.3–1.2)
Total Protein: 6.9 g/dL (ref 6.5–8.1)

## 2018-10-27 LAB — CBC WITH DIFFERENTIAL/PLATELET
Abs Immature Granulocytes: 0.01 10*3/uL (ref 0.00–0.07)
Basophils Absolute: 0.1 10*3/uL (ref 0.0–0.1)
Basophils Relative: 2 %
Eosinophils Absolute: 0.2 10*3/uL (ref 0.0–0.5)
Eosinophils Relative: 3 %
HCT: 37.7 % (ref 36.0–46.0)
Hemoglobin: 12.2 g/dL (ref 12.0–15.0)
Immature Granulocytes: 0 %
Lymphocytes Relative: 32 %
Lymphs Abs: 1.6 10*3/uL (ref 0.7–4.0)
MCH: 31.4 pg (ref 26.0–34.0)
MCHC: 32.4 g/dL (ref 30.0–36.0)
MCV: 97.2 fL (ref 80.0–100.0)
Monocytes Absolute: 0.4 10*3/uL (ref 0.1–1.0)
Monocytes Relative: 8 %
Neutro Abs: 2.8 10*3/uL (ref 1.7–7.7)
Neutrophils Relative %: 55 %
Platelets: 284 10*3/uL (ref 150–400)
RBC: 3.88 MIL/uL (ref 3.87–5.11)
RDW: 12.2 % (ref 11.5–15.5)
WBC: 5 10*3/uL (ref 4.0–10.5)
nRBC: 0 % (ref 0.0–0.2)

## 2018-10-27 NOTE — Progress Notes (Signed)

## 2018-10-28 ENCOUNTER — Other Ambulatory Visit: Payer: Self-pay

## 2018-10-28 ENCOUNTER — Ambulatory Visit
Admission: RE | Admit: 2018-10-28 | Discharge: 2018-10-28 | Disposition: A | Discharge status: Home / Self Care | Payer: 59 | Source: Ambulatory Visit | Attending: Surgery | Admitting: Surgery

## 2018-10-28 DIAGNOSIS — D242 Benign neoplasm of left breast: Secondary | ICD-10-CM

## 2018-10-29 ENCOUNTER — Ambulatory Visit (HOSPITAL_BASED_OUTPATIENT_CLINIC_OR_DEPARTMENT_OTHER): Payer: 59 | Admitting: Certified Registered"

## 2018-10-29 ENCOUNTER — Encounter (HOSPITAL_BASED_OUTPATIENT_CLINIC_OR_DEPARTMENT_OTHER)
Admission: RE | Disposition: A | Discharge status: Home / Self Care | Payer: Self-pay | Source: Home / Self Care | Attending: Surgery

## 2018-10-29 ENCOUNTER — Other Ambulatory Visit: Payer: Self-pay

## 2018-10-29 ENCOUNTER — Ambulatory Visit
Admission: RE | Admit: 2018-10-29 | Discharge: 2018-10-29 | Disposition: A | Discharge status: Home / Self Care | Payer: 59 | Source: Ambulatory Visit | Attending: Surgery | Admitting: Surgery

## 2018-10-29 ENCOUNTER — Encounter (HOSPITAL_BASED_OUTPATIENT_CLINIC_OR_DEPARTMENT_OTHER): Payer: Self-pay

## 2018-10-29 ENCOUNTER — Ambulatory Visit (HOSPITAL_BASED_OUTPATIENT_CLINIC_OR_DEPARTMENT_OTHER)
Admission: RE | Admit: 2018-10-29 | Discharge: 2018-10-29 | Disposition: A | Discharge status: Home / Self Care | Payer: 59 | Attending: Surgery | Admitting: Surgery

## 2018-10-29 DIAGNOSIS — D242 Benign neoplasm of left breast: Secondary | ICD-10-CM | POA: Diagnosis not present

## 2018-10-29 HISTORY — PX: BREAST LUMPECTOMY WITH RADIOACTIVE SEED LOCALIZATION: SHX6424

## 2018-10-29 HISTORY — DX: Acne, unspecified: L70.9

## 2018-10-29 SURGERY — BREAST LUMPECTOMY WITH RADIOACTIVE SEED LOCALIZATION
Anesthesia: General | Site: Breast | Laterality: Left

## 2018-10-29 MED ORDER — FENTANYL CITRATE (PF) 100 MCG/2ML IJ SOLN: 25.0000 ug | INTRAMUSCULAR | Status: DC | PRN

## 2018-10-29 MED ORDER — LACTATED RINGERS IV SOLN
INTRAVENOUS | Status: DC
  Administered 2018-10-29 (×2): via INTRAVENOUS

## 2018-10-29 MED ORDER — PROPOFOL 10 MG/ML IV BOLUS
INTRAVENOUS | Status: AC
  Filled 2018-10-29: qty 20

## 2018-10-29 MED ORDER — CELECOXIB 200 MG PO CAPS
200.0000 mg | ORAL_CAPSULE | ORAL | Status: AC
  Administered 2018-10-29: 12:00:00 200 mg via ORAL

## 2018-10-29 MED ORDER — DIPHENHYDRAMINE HCL 50 MG/ML IJ SOLN
INTRAMUSCULAR | Status: DC | PRN
  Administered 2018-10-29: 12.5 mg via INTRAVENOUS

## 2018-10-29 MED ORDER — CELECOXIB 200 MG PO CAPS
ORAL_CAPSULE | ORAL | Status: AC
  Filled 2018-10-29: qty 1

## 2018-10-29 MED ORDER — ONDANSETRON HCL 4 MG/2ML IJ SOLN
INTRAMUSCULAR | Status: DC | PRN
  Administered 2018-10-29: 4 mg via INTRAVENOUS

## 2018-10-29 MED ORDER — CEFAZOLIN SODIUM-DEXTROSE 2-4 GM/100ML-% IV SOLN
INTRAVENOUS | Status: AC
  Filled 2018-10-29: qty 100

## 2018-10-29 MED ORDER — CEFAZOLIN SODIUM-DEXTROSE 2-4 GM/100ML-% IV SOLN
2.0000 g | INTRAVENOUS | Status: AC
  Administered 2018-10-29: 13:00:00 2 g via INTRAVENOUS

## 2018-10-29 MED ORDER — CHLORHEXIDINE GLUCONATE CLOTH 2 % EX PADS: 6.0000 | MEDICATED_PAD | Freq: Once | CUTANEOUS | Status: DC

## 2018-10-29 MED ORDER — IBUPROFEN 800 MG PO TABS: 800.0000 mg | ORAL_TABLET | Freq: Three times a day (TID) | ORAL | 0 refills | Status: AC | PRN

## 2018-10-29 MED ORDER — MIDAZOLAM HCL 2 MG/2ML IJ SOLN
1.0000 mg | INTRAMUSCULAR | Status: DC | PRN
  Administered 2018-10-29: 13:00:00 2 mg via INTRAVENOUS

## 2018-10-29 MED ORDER — ONDANSETRON HCL 4 MG/2ML IJ SOLN
INTRAMUSCULAR | Status: AC
  Filled 2018-10-29: qty 2

## 2018-10-29 MED ORDER — FENTANYL CITRATE (PF) 100 MCG/2ML IJ SOLN
INTRAMUSCULAR | Status: AC
  Filled 2018-10-29: qty 2

## 2018-10-29 MED ORDER — GABAPENTIN 300 MG PO CAPS
ORAL_CAPSULE | ORAL | Status: AC
  Filled 2018-10-29: qty 1

## 2018-10-29 MED ORDER — HYDROCODONE-ACETAMINOPHEN 5-325 MG PO TABS: 1.0000 | ORAL_TABLET | Freq: Four times a day (QID) | ORAL | 0 refills | Status: AC | PRN

## 2018-10-29 MED ORDER — LIDOCAINE 2% (20 MG/ML) 5 ML SYRINGE
INTRAMUSCULAR | Status: AC
  Filled 2018-10-29: qty 5

## 2018-10-29 MED ORDER — PROPOFOL 10 MG/ML IV BOLUS
INTRAVENOUS | Status: DC | PRN
  Administered 2018-10-29: 200 mg via INTRAVENOUS

## 2018-10-29 MED ORDER — GABAPENTIN 300 MG PO CAPS
300.0000 mg | ORAL_CAPSULE | ORAL | Status: AC
  Administered 2018-10-29: 300 mg via ORAL

## 2018-10-29 MED ORDER — ACETAMINOPHEN 500 MG PO TABS
ORAL_TABLET | ORAL | Status: AC
  Filled 2018-10-29: qty 2

## 2018-10-29 MED ORDER — BUPIVACAINE-EPINEPHRINE (PF) 0.25% -1:200000 IJ SOLN
INTRAMUSCULAR | Status: DC | PRN
  Administered 2018-10-29: 15 mL

## 2018-10-29 MED ORDER — DEXAMETHASONE SODIUM PHOSPHATE 10 MG/ML IJ SOLN
INTRAMUSCULAR | Status: AC
  Filled 2018-10-29: qty 1

## 2018-10-29 MED ORDER — SCOPOLAMINE 1 MG/3DAYS TD PT72
1.0000 | MEDICATED_PATCH | TRANSDERMAL | Status: DC
  Administered 2018-10-29: 12:00:00 1.5 mg via TRANSDERMAL

## 2018-10-29 MED ORDER — EPHEDRINE SULFATE 50 MG/ML IJ SOLN
INTRAMUSCULAR | Status: DC | PRN
  Administered 2018-10-29 (×2): 10 mg via INTRAVENOUS

## 2018-10-29 MED ORDER — ACETAMINOPHEN 500 MG PO TABS
1000.0000 mg | ORAL_TABLET | ORAL | Status: AC
  Administered 2018-10-29: 12:00:00 1000 mg via ORAL

## 2018-10-29 MED ORDER — DEXAMETHASONE SODIUM PHOSPHATE 4 MG/ML IJ SOLN
INTRAMUSCULAR | Status: DC | PRN
  Administered 2018-10-29: 10 mg via INTRAVENOUS

## 2018-10-29 MED ORDER — LIDOCAINE 2% (20 MG/ML) 5 ML SYRINGE
INTRAMUSCULAR | Status: DC | PRN
  Administered 2018-10-29: 40 mg via INTRAVENOUS

## 2018-10-29 MED ORDER — MIDAZOLAM HCL 2 MG/2ML IJ SOLN
INTRAMUSCULAR | Status: AC
  Filled 2018-10-29: qty 2

## 2018-10-29 MED ORDER — SCOPOLAMINE 1 MG/3DAYS TD PT72
MEDICATED_PATCH | TRANSDERMAL | Status: AC
  Filled 2018-10-29: qty 1

## 2018-10-29 MED ORDER — FENTANYL CITRATE (PF) 100 MCG/2ML IJ SOLN
50.0000 ug | INTRAMUSCULAR | Status: DC | PRN
  Administered 2018-10-29: 13:00:00 50 ug via INTRAVENOUS

## 2018-10-29 MED ORDER — GLYCOPYRROLATE 0.2 MG/ML IJ SOLN
INTRAMUSCULAR | Status: DC | PRN
  Administered 2018-10-29: 0.2 mg via INTRAVENOUS

## 2018-10-29 SURGICAL SUPPLY — 54 items
APPLIER CLIP 9.375 MED OPEN (MISCELLANEOUS)
BINDER BREAST LRG (GAUZE/BANDAGES/DRESSINGS) IMPLANT
BINDER BREAST MEDIUM (GAUZE/BANDAGES/DRESSINGS) ×2 IMPLANT
BINDER BREAST XLRG (GAUZE/BANDAGES/DRESSINGS) IMPLANT
BINDER BREAST XXLRG (GAUZE/BANDAGES/DRESSINGS) IMPLANT
BLADE SURG 15 STRL LF DISP TIS (BLADE) ×1 IMPLANT
BLADE SURG 15 STRL SS (BLADE) ×2
CANISTER SUC SOCK COL 7IN (MISCELLANEOUS) IMPLANT
CANISTER SUCT 1200ML W/VALVE (MISCELLANEOUS) IMPLANT
CHLORAPREP W/TINT 26 (MISCELLANEOUS) ×3 IMPLANT
CLIP APPLIE 9.375 MED OPEN (MISCELLANEOUS) IMPLANT
COVER BACK TABLE REUSABLE LG (DRAPES) ×3 IMPLANT
COVER MAYO STAND REUSABLE (DRAPES) ×3 IMPLANT
COVER PROBE W GEL 5X96 (DRAPES) ×3 IMPLANT
COVER WAND RF STERILE (DRAPES) IMPLANT
DECANTER SPIKE VIAL GLASS SM (MISCELLANEOUS) IMPLANT
DERMABOND ADVANCED (GAUZE/BANDAGES/DRESSINGS) ×2
DERMABOND ADVANCED .7 DNX12 (GAUZE/BANDAGES/DRESSINGS) ×1 IMPLANT
DRAPE LAPAROSCOPIC ABDOMINAL (DRAPES) IMPLANT
DRAPE LAPAROTOMY 100X72 PEDS (DRAPES) ×3 IMPLANT
DRAPE UTILITY XL STRL (DRAPES) ×3 IMPLANT
ELECT COATED BLADE 2.86 ST (ELECTRODE) ×3 IMPLANT
ELECT REM PT RETURN 9FT ADLT (ELECTROSURGICAL) ×3
ELECTRODE REM PT RTRN 9FT ADLT (ELECTROSURGICAL) ×1 IMPLANT
GLOVE BIO SURGEON STRL SZ7 (GLOVE) ×2 IMPLANT
GLOVE BIOGEL PI IND STRL 7.0 (GLOVE) IMPLANT
GLOVE BIOGEL PI IND STRL 7.5 (GLOVE) IMPLANT
GLOVE BIOGEL PI IND STRL 8 (GLOVE) ×1 IMPLANT
GLOVE BIOGEL PI INDICATOR 7.0 (GLOVE) ×4
GLOVE BIOGEL PI INDICATOR 7.5 (GLOVE) ×2
GLOVE BIOGEL PI INDICATOR 8 (GLOVE) ×2
GLOVE ECLIPSE 8.0 STRL XLNG CF (GLOVE) ×3 IMPLANT
GLOVE SURG SS PI 7.0 STRL IVOR (GLOVE) ×2 IMPLANT
GOWN STRL REUS W/ TWL LRG LVL3 (GOWN DISPOSABLE) ×2 IMPLANT
GOWN STRL REUS W/TWL LRG LVL3 (GOWN DISPOSABLE) ×4
HEMOSTAT ARISTA ABSORB 3G PWDR (HEMOSTASIS) IMPLANT
HEMOSTAT SNOW SURGICEL 2X4 (HEMOSTASIS) IMPLANT
KIT MARKER MARGIN INK (KITS) ×3 IMPLANT
NDL HYPO 25X1 1.5 SAFETY (NEEDLE) ×1 IMPLANT
NEEDLE HYPO 25X1 1.5 SAFETY (NEEDLE) ×3 IMPLANT
NS IRRIG 1000ML POUR BTL (IV SOLUTION) ×3 IMPLANT
PACK BASIN DAY SURGERY FS (CUSTOM PROCEDURE TRAY) ×3 IMPLANT
PENCIL BUTTON HOLSTER BLD 10FT (ELECTRODE) ×3 IMPLANT
SLEEVE SCD COMPRESS KNEE MED (MISCELLANEOUS) ×3 IMPLANT
SPONGE LAP 4X18 RFD (DISPOSABLE) ×3 IMPLANT
SUT MNCRL AB 4-0 PS2 18 (SUTURE) ×3 IMPLANT
SUT SILK 2 0 SH (SUTURE) IMPLANT
SUT VICRYL 3-0 CR8 SH (SUTURE) ×3 IMPLANT
SYR CONTROL 10ML LL (SYRINGE) ×3 IMPLANT
TOWEL GREEN STERILE FF (TOWEL DISPOSABLE) ×3 IMPLANT
TRAY FAXITRON CT DISP (TRAY / TRAY PROCEDURE) ×3 IMPLANT
TUBE CONNECTING 20'X1/4 (TUBING)
TUBE CONNECTING 20X1/4 (TUBING) IMPLANT
YANKAUER SUCT BULB TIP NO VENT (SUCTIONS) IMPLANT

## 2018-10-29 NOTE — Op Note (Signed)
Preoperative diagnosis: Left breast papilloma  Postoperative diagnosis: Same  Procedure: Left breast seed localized lumpectomy  Surgeon: Erroll Luna, MD  Anesthesia: General with 0.25% Sensorcaine with epinephrine local  EBL: 10 cc  Drains: None  Specimen: Lumpectomy with seed verified by Faxitron.  Additional fragments which include a large papilloma containing the clip and this was sent separately.  This was oriented.  IV fluids: Per anesthesia record  Indications for procedure: The patient presents for left breast lumpectomy secondary to a mammographic detected abnormality.  Core biopsy showed papilloma and excision was recommended to exclude malignancy.The procedure has been discussed with the patient. Alternatives to surgery have been discussed with the patient.  Risks of surgery include bleeding,  Infection,  Seroma formation, death,  and the need for further surgery.   The patient understands and wishes to proceed.   Description of procedure: The patient was met in the holding area and questions were answered.  Neoprobe was used to verify seed activity and location in the left central breast.  Questions were answered.  She was taken back to the operative room.  She is placed supine upon the OR table.  After induction of general anesthesia, the left breast was prepped and draped in a sterile fashion and timeout with formed.  Films were available to review.  The seed and clip were localized using neoprobe and a curvilinear incision was made on the medial border of the nipple areolar complex.  Dissection was carried down and the tissue around the seed was excised.  There is a large papilloma medial and inferior to this as well.  I excised this separately and placed into a separate container.  The Faxitron showed the seed in the first specimen and the clip and the second.  This was all oriented and sent to pathology.  Hemostasis was achieved.  The wound was then irrigated and then closed  with 3-0 Vicryl and 4 Monocryl.  Local anesthetic was infiltrated prior to closure through the lumpectomy.  All final counts were found to be correct.  Dermabond applied.  Patient awoke extubated taken recovery in satisfactory condition.

## 2018-10-29 NOTE — Anesthesia Procedure Notes (Signed)
Procedure Name: LMA Insertion Date/Time: 10/29/2018 12:51 PM Performed by: Maryella Shivers, CRNA Pre-anesthesia Checklist: Patient identified, Emergency Drugs available, Suction available and Patient being monitored Patient Re-evaluated:Patient Re-evaluated prior to induction Oxygen Delivery Method: Circle system utilized Preoxygenation: Pre-oxygenation with 100% oxygen Induction Type: IV induction Ventilation: Mask ventilation without difficulty LMA: LMA inserted LMA Size: 4.0 Number of attempts: 1 Airway Equipment and Method: Bite block Placement Confirmation: positive ETCO2 Tube secured with: Tape Dental Injury: Teeth and Oropharynx as per pre-operative assessment

## 2018-10-29 NOTE — Discharge Instructions (Signed)
[facility] °Office Phone Number [phone] ° °BREAST BIOPSY/ PARTIAL MASTECTOMY: POST OP INSTRUCTIONS ° °Always review your discharge instruction sheet given to you by the facility where your surgery was performed. ° °IF YOU HAVE DISABILITY OR FAMILY LEAVE FORMS, YOU MUST BRING THEM TO THE OFFICE FOR PROCESSING.  DO NOT GIVE THEM TO YOUR DOCTOR. ° °1. A prescription for pain medication may be given to you upon discharge.  Take your pain medication as prescribed, if needed.  If narcotic pain medicine is not needed, then you may take acetaminophen (Tylenol) or ibuprofen (Advil) as needed. °2. Take your usually prescribed medications unless otherwise directed °3. If you need a refill on your pain medication, please contact your pharmacy.  They will contact our office to request authorization.  Prescriptions will not be filled after 5pm or on week-ends. °4. You should eat very light the first 24 hours after surgery, such as soup, crackers, pudding, etc.  Resume your normal diet the day after surgery. °5. Most patients will experience some swelling and bruising in the breast.  Ice packs and a good support bra will help.  Swelling and bruising can take several days to resolve.  °6. It is common to experience some constipation if taking pain medication after surgery.  Increasing fluid intake and taking a stool softener will usually help or prevent this problem from occurring.  A mild laxative (Milk of Magnesia or Miralax) should be taken according to package directions if there are no bowel movements after 48 hours. °7. Unless discharge instructions indicate otherwise, you may remove your bandages 24-48 hours after surgery, and you may shower at that time.  You may have steri-strips (small skin tapes) in place directly over the incision.  These strips should be left on the skin for 7-10 days.  If your surgeon used skin glue on the incision, you may shower in 24 hours.  The glue will flake off over the  next 2-3 weeks.  Any sutures or staples will be removed at the office during your follow-up visit. °8. ACTIVITIES:  You may resume regular daily activities (gradually increasing) beginning the next day.  Wearing a good support bra or sports bra minimizes pain and swelling.  You may have sexual intercourse when it is comfortable. °a. You may drive when you no longer are taking prescription pain medication, you can comfortably wear a seatbelt, and you can safely maneuver your car and apply brakes. °b. RETURN TO WORK:  ______________________________________________________________________________________ °9. You should see your doctor in the office for a follow-up appointment approximately two weeks after your surgery.  Your doctor’s nurse will typically make your follow-up appointment when she calls you with your pathology report.  Expect your pathology report 2-3 business days after your surgery.  You may call to check if you do not hear from us after three days. °10. OTHER INSTRUCTIONS: _______________________________________________________________________________________________ _____________________________________________________________________________________________________________________________________ °_____________________________________________________________________________________________________________________________________ °_____________________________________________________________________________________________________________________________________ ° °WHEN TO CALL YOUR DOCTOR: °1. Fever over 101.0 °2. Nausea and/or vomiting. °3. Extreme swelling or bruising. °4. Continued bleeding from incision. °5. Increased pain, redness, or drainage from the incision. ° °The clinic staff is available to answer your questions during regular business hours.  Please don’t hesitate to call and ask to speak to one of the nurses for clinical concerns.  If you have a medical emergency, go to the nearest  emergency room or call 911.  A surgeon from [facility] is always on call at the hospital. ° °For further questions, please visit [email or web address]  ° ° °  No Tylenol until 5pm No Ibuprofen until 7 pm    Post Anesthesia Home Care Instructions  Activity: Get plenty of rest for the remainder of the day. A responsible individual must stay with you for 24 hours following the procedure.  For the next 24 hours, DO NOT: -Drive a car -Paediatric nurse -Drink alcoholic beverages -Take any medication unless instructed by your physician -Make any legal decisions or sign important papers.  Meals: Start with liquid foods such as gelatin or soup. Progress to regular foods as tolerated. Avoid greasy, spicy, heavy foods. If nausea and/or vomiting occur, drink only clear liquids until the nausea and/or vomiting subsides. Call your physician if vomiting continues.  Special Instructions/Symptoms: Your throat may feel dry or sore from the anesthesia or the breathing tube placed in your throat during surgery. If this causes discomfort, gargle with warm salt water. The discomfort should disappear within 24 hours.  If you had a scopolamine patch placed behind your ear for the management of post- operative nausea and/or vomiting:  1. The medication in the patch is effective for 72 hours, after which it should be removed.  Wrap patch in a tissue and discard in the trash. Wash hands thoroughly with soap and water. 2. You may remove the patch earlier than 72 hours if you experience unpleasant side effects which may include dry mouth, dizziness or visual disturbances. 3. Avoid touching the patch. Wash your hands with soap and water after contact with the patch.

## 2018-10-29 NOTE — Anesthesia Preprocedure Evaluation (Addendum)
Anesthesia Evaluation  Patient identified by MRN, date of birth, ID band Patient awake    Reviewed: Allergy & Precautions, NPO status , Patient's Chart, lab work & pertinent test results  History of Anesthesia Complications (+) PONV and history of anesthetic complications  Airway Mallampati: I  TM Distance: >3 FB Neck ROM: Full    Dental no notable dental hx. (+) Teeth Intact, Dental Advisory Given   Pulmonary neg pulmonary ROS, Patient abstained from smoking.,    Pulmonary exam normal breath sounds clear to auscultation       Cardiovascular negative cardio ROS Normal cardiovascular exam Rhythm:Regular Rate:Normal     Neuro/Psych negative neurological ROS  negative psych ROS   GI/Hepatic negative GI ROS, Neg liver ROS,   Endo/Other  negative endocrine ROS  Renal/GU negative Renal ROS  negative genitourinary   Musculoskeletal negative musculoskeletal ROS (+)   Abdominal   Peds  Hematology negative hematology ROS (+)   Anesthesia Other Findings Left breast papilloma  Reproductive/Obstetrics                            Anesthesia Physical Anesthesia Plan  ASA: I  Anesthesia Plan: General   Post-op Pain Management:    Induction: Intravenous  PONV Risk Score and Plan: 3 and Ondansetron, Dexamethasone, Midazolam, Scopolamine patch - Pre-op and Diphenhydramine  Airway Management Planned: LMA  Additional Equipment:   Intra-op Plan:   Post-operative Plan: Extubation in OR  Informed Consent: I have reviewed the patients History and Physical, chart, labs and discussed the procedure including the risks, benefits and alternatives for the proposed anesthesia with the patient or authorized representative who has indicated his/her understanding and acceptance.     Dental advisory given  Plan Discussed with: CRNA  Anesthesia Plan Comments:        Anesthesia Quick Evaluation

## 2018-10-29 NOTE — H&P (Signed)
Cathy Yoder  Location: [facility] Patient #: [id number] DOB: 1975/05/19 Married / Language: English / Race: Asian Female  History of Present Illness  Patient words: Patient sent request of Dr.Hu for abnormal mammogram. She had an area in her left upper inner quadrant of her breast core biopsy proven to be a papillary lesion with no atypia. She has no family history. Denies breast pain, nipple discharge or breast mass bilaterally.                  Diagnosis Breast, left, needle core biopsy, 10 o'clock position, upper inner - PAPILLARY LESION. SEE NOTE Diagnosis Note The findings are suggestive of an intraductal papilloma. Immunohistochemical stains for E-cadherin and CK5/6 are negative for presence of neoplasia in the submitted biopsy. Dr. Melina Copa has reviewed the case and concurs with the above interpretation. The [facility] was notified on 09/09/2018. Jaquita Folds MD Pathologist, Electronic Signature (Case signed 09/09/2018) Specimen Gross and Clinical Information   CLINICAL DATA: 43 year old female with new palpable LEFT breast lump identified on self-examination. Also for annual bilateral mammogram.  EXAM: DIGITAL DIAGNOSTIC BILATERAL MAMMOGRAM WITH CAD AND TOMO  ULTRASOUND LEFT BREAST  COMPARISON: Previous exam(s).  ACR Breast Density Category c: The breast tissue is heterogeneously dense, which may obscure small masses.  FINDINGS: 2D/3D full field views of both breasts and spot compression view of the LEFT breast demonstrate an obscured mass within the UPPER INNER LEFT breast at the site of palpable concern.  No other mass, distortion or worrisome calcifications are noted within either breast.  Mammographic images were processed with CAD.  On physical exam, a firm palpable mass identified at the 10 o'clock position of the LEFT breast 1 cm from the nipple.  Targeted  ultrasound is performed, showing a 1.3 x 0.9 x 1.5 cm partially circumscribed partially indistinct hypoechoic mass at the 10 o'clock position of the LEFT breast 1 cm from the nipple with internal vascular flow. There is equivocal irregularity along the anterior aspect of this mass.  No abnormal LEFT axillary lymph nodes are identified.  IMPRESSION: Indeterminate 1.5 cm UPPER INNER LEFT breast mass. Tissue sampling is recommended.  No abnormal LEFT axillary lymph nodes.  RECOMMENDATION: Ultrasound-guided LEFT breast biopsy, which will be scheduled.  I have discussed the findings and recommendations with the patient. If applicable, a reminder letter will be sent to the patient regarding the next appointment.  BI-RADS CATEGORY 4: Suspicious.   Electronically Signed By: Margarette Canada M.D. On: 09/07/2018 13:38.  The patient is a 43 year old female.   Allergies  No Known Drug Allergies  Allergies Reconciled  Medication History  Spironolactone (100MG  Tablet, Oral) Active. Clindamycin Phosphate (1% Lotion, External) Active. Medications Reconciled    Vitals  09/18/2018 9:33 AM Weight: 138.4 lb Height: 64in Body Surface Area: 1.67 m Body Mass Index: 23.76 kg/m  Temp.: 98.69F  Pulse: 95 (Regular)  BP: 124/88 (Sitting, Left Arm, Standard)        Physical Exam   General Mental Status-Alert. General Appearance-Consistent with stated age. Hydration-Well hydrated. Voice-Normal.  Head and Neck Head-normocephalic, atraumatic with no lesions or palpable masses. Trachea-midline. Thyroid Gland Characteristics - normal size and consistency.  Eye Eyeball - Bilateral-Extraocular movements intact. Sclera/Conjunctiva - Bilateral-No scleral icterus.  Chest and Lung Exam Chest and lung exam reveals -quiet, even and easy respiratory effort with no use of accessory muscles and on auscultation, normal breath  sounds, no adventitious sounds and normal vocal resonance. Inspection Chest Wall -  Normal. Back - normal.  Breast Breast - Left-Symmetric, Non Tender, No Biopsy scars, no Dimpling - Left, No Inflammation, No Lumpectomy scars, No Mastectomy scars, No Peau d' Orange. Breast - Right-Symmetric, Non Tender, No Biopsy scars, no Dimpling - Right, No Inflammation, No Lumpectomy scars, No Mastectomy scars, No Peau d' Orange. Breast Lump-No Palpable Breast Mass. Note: Left breast shows bruising in the medial aspect.  Cardiovascular Cardiovascular examination reveals -normal heart sounds, regular rate and rhythm with no murmurs and normal pedal pulses bilaterally.  Neurologic Neurologic evaluation reveals -alert and oriented x 3 with no impairment of recent or remote memory. Mental Status-Normal.  Lymphatic Head & Neck  General Head & Neck Lymphatics: Bilateral - Description - Normal. Axillary  General Axillary Region: Bilateral - Description - Normal. Tenderness - Non Tender.    Assessment & Plan  INTRADUCTAL PAPILLOMA OF BREAST, LEFT (D24.2) Impression: Discussed lumpectomy versus observation. Discussed potential upgrade risk of 10%. She is opted for left breast seed Risk of lumpectomy include bleeding, infection, seroma, more surgery, use of seed/wire, wound care, cosmetic deformity and the need for other treatments, death , blood clots, death. Pt agrees to proceed. localized lumpectomy.  Current Plans Pt Education - [facility] Free Text Education/Instructions: discussed with patient and provided information. Pt Education - [facility] Breast Biopsy HCI: discussed with patient and provided information. The anatomy and the physiology was discussed. The pathophysiology and natural history of the disease was discussed. Options were discussed and recommendations were made. Technique, risks, benefits, & alternatives were discussed. Risks such as stroke, heart attack, bleeding,  indection, death, and other risks discussed. Questions answered. The patient agrees to proceed.

## 2018-10-29 NOTE — Anesthesia Postprocedure Evaluation (Signed)
Anesthesia Post Note  Patient: Cathy Yoder  Procedure(s) Performed: LEFT BREAST LUMPECTOMY WITH RADIOACTIVE SEED LOCALIZATION (Left Breast)     Patient location during evaluation: PACU Anesthesia Type: General Level of consciousness: awake and alert Pain management: pain level controlled Vital Signs Assessment: post-procedure vital signs reviewed and stable Respiratory status: spontaneous breathing, nonlabored ventilation, respiratory function stable and patient connected to nasal cannula oxygen Cardiovascular status: blood pressure returned to baseline and stable Postop Assessment: no apparent nausea or vomiting Anesthetic complications: no    Last Vitals:  Vitals:   10/29/18 1400 10/29/18 1415  BP: 105/78 101/65  Pulse: 80 76  Resp: 17 14  Temp:    SpO2: 100% 100%    Last Pain:  Vitals:   10/29/18 1430  TempSrc:   PainSc: 0-No pain                 Roni Scow L Alenah Sarria

## 2018-10-29 NOTE — Interval H&P Note (Signed)
History and Physical Interval Note:  10/29/2018 12:20 PM  Cathy Yoder  has presented today for surgery, with the diagnosis of LEFT BREAST PAPILLOMA.  The various methods of treatment have been discussed with the patient and family. After consideration of risks, benefits and other options for treatment, the patient has consented to  Procedure(s): LEFT BREAST LUMPECTOMY WITH RADIOACTIVE SEED LOCALIZATION (Left) as a surgical intervention.  The patient's history has been reviewed, patient examined, no change in status, stable for surgery.  I have reviewed the patient's chart and labs.  Questions were answered to the patient's satisfaction.     [place]

## 2018-10-29 NOTE — Transfer of Care (Signed)
Immediate Anesthesia Transfer of Care Note  Patient: Cathy Yoder  Procedure(s) Performed: LEFT BREAST LUMPECTOMY WITH RADIOACTIVE SEED LOCALIZATION (Left Breast)  Patient Location: PACU  Anesthesia Type:General  Level of Consciousness: sedated  Airway & Oxygen Therapy: Patient Spontanous Breathing and Patient connected to nasal cannula oxygen  Post-op Assessment: Report given to RN and Post -op Vital signs reviewed and stable  Post vital signs: Reviewed and stable  Last Vitals:  Vitals Value Taken Time  BP 116/74 10/29/18 1336  Temp 36.5 C 10/29/18 1336  Pulse 89 10/29/18 1340  Resp 18 10/29/18 1340  SpO2 100 % 10/29/18 1340  Vitals shown include unvalidated device data.  Last Pain:  Vitals:   10/29/18 1121  TempSrc: Oral  PainSc: 0-No pain      Patients Stated Pain Goal: 4 [phone]  Complications: No apparent anesthesia complications

## 2018-10-30 ENCOUNTER — Encounter (HOSPITAL_BASED_OUTPATIENT_CLINIC_OR_DEPARTMENT_OTHER): Payer: Self-pay | Admitting: Surgery

## 2019-12-06 IMAGING — MG MM CLIP PLACEMENT
4 series · 4 of 12 positions shown · non-contrast
Comparison: Previous exam(s).

CLINICAL DATA: Evaluate RIBBON clip position following
ultrasound-guided LEFT breast biopsy.

EXAM:
3D DIAGNOSTIC LEFT MAMMOGRAM POST ULTRASOUND BIOPSY

[L CC synth-2D]
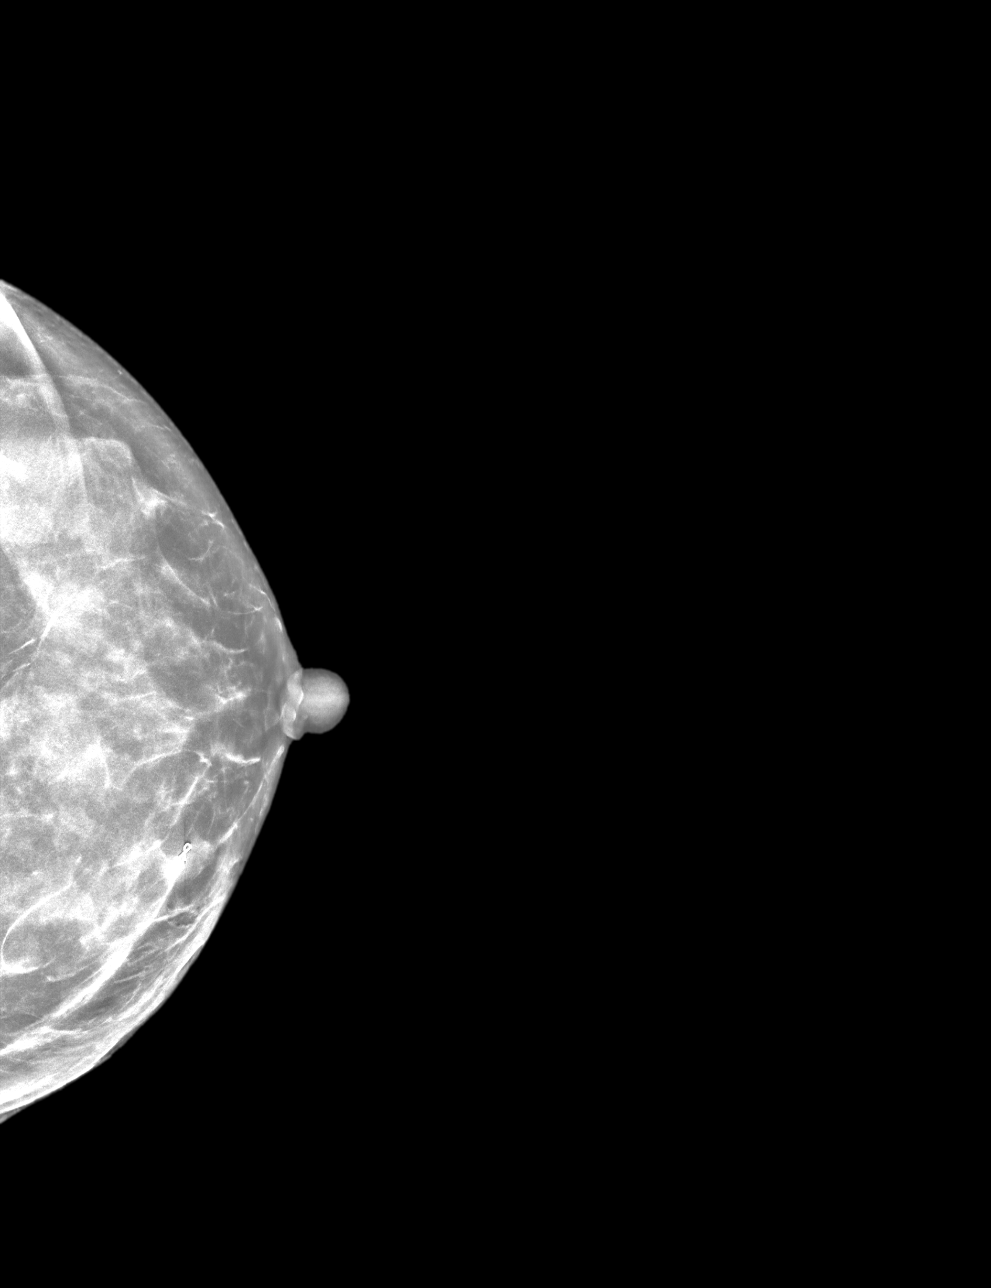

[L ML synth-2D]
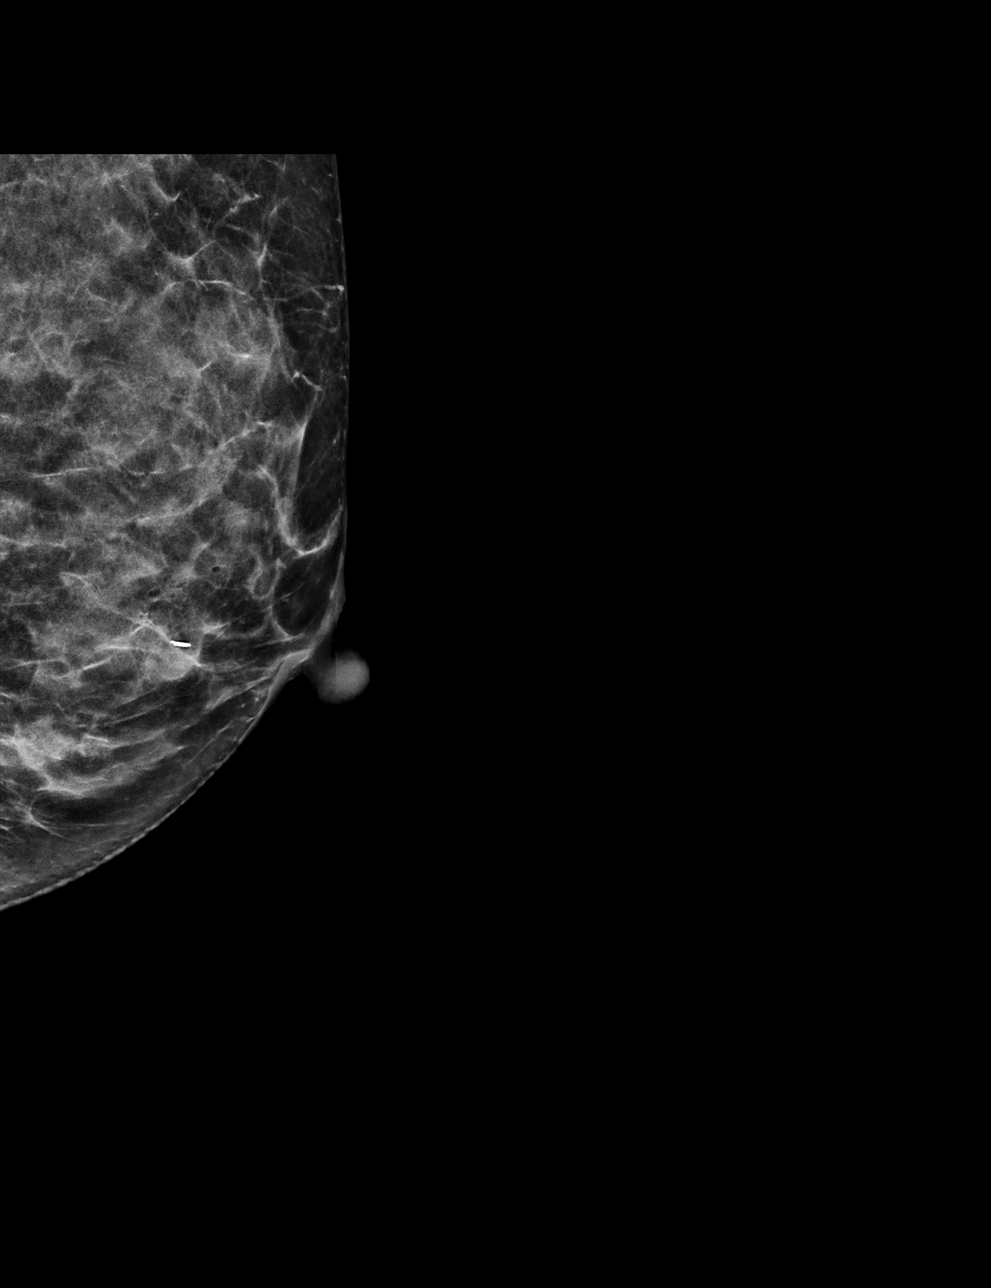

[L ML tomo · tomo slice 24/47.0]
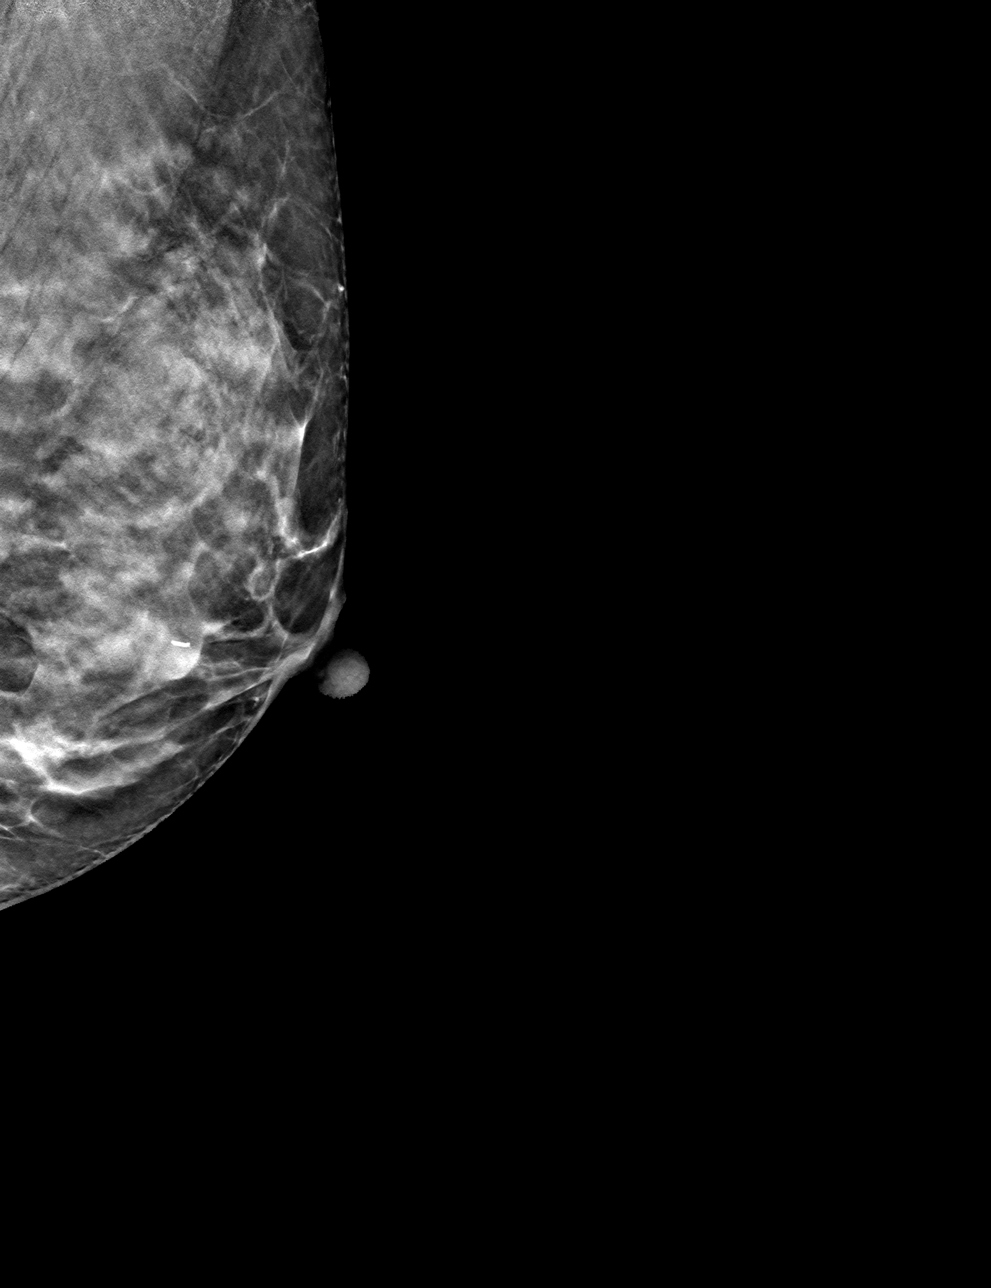

[L CC tomo · tomo slice 29/57.0]
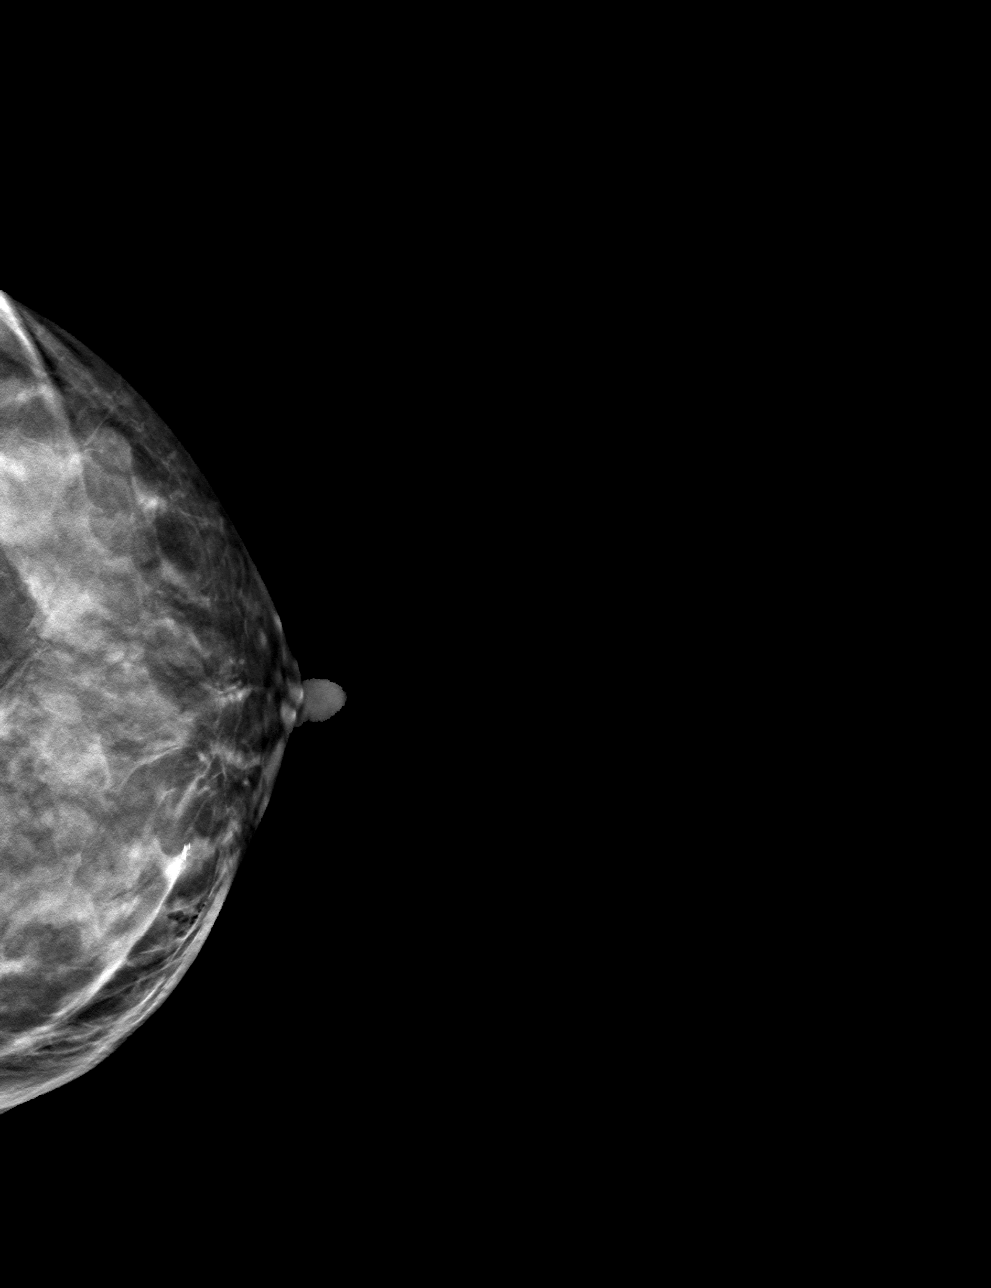

[4 of 12 positions shown; findings below may reference images not displayed]

FINDINGS: 3D Mammographic images were obtained following ultrasound guided
biopsy of the 1.5 cm mass at the 10 o'clock position of the LEFT
breast.

The RIBBON clip is in satisfactory position and corresponds to the
mammographic mass.
IMPRESSION: Satisfactory RIBBON clip position following ultrasound-guided LEFT
breast biopsy.

Final Assessment: Post Procedure Mammograms for Marker Placement

## 2019-12-06 IMAGING — US US BREAST BX W LOC DEV 1ST LESION IMG BX SPEC US GUIDE*L*
1 series · 8 of 8 positions shown · non-contrast
Comparison: Previous exam(s).
COMPARISON: Previous exam(s).

Addendum:
CLINICAL DATA: 42-year-old female for tissue sampling of 1.5 cm
UPPER INNER LEFT breast mass.

EXAM:
ULTRASOUND GUIDED LEFT BREAST CORE NEEDLE BIOPSY

[Series 1: us breast bx w loc dev 1st lesion img bx spec us g · 0.06mm/px · 8 of 8 slices shown]
[im 1/8]
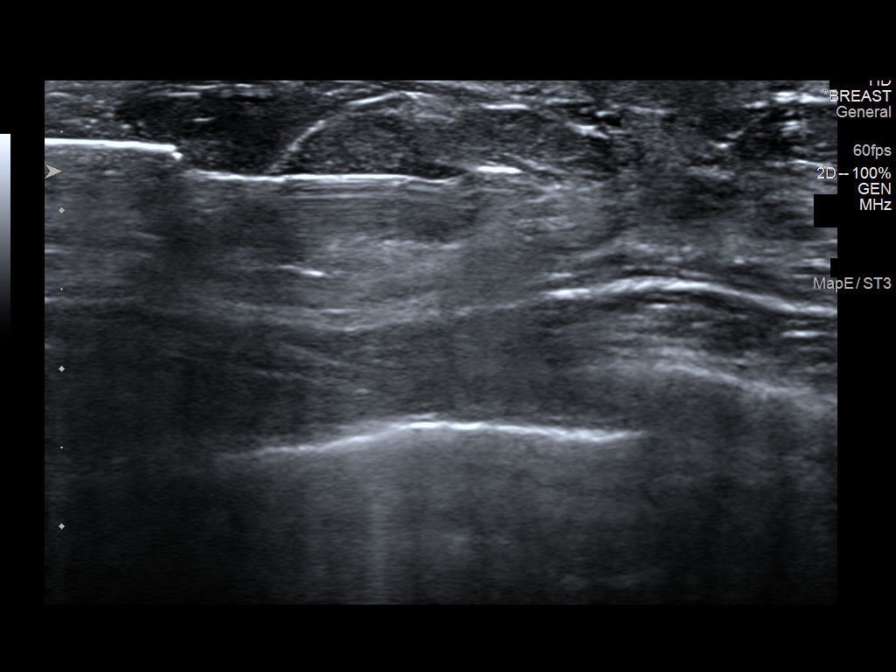
[im 2/8]
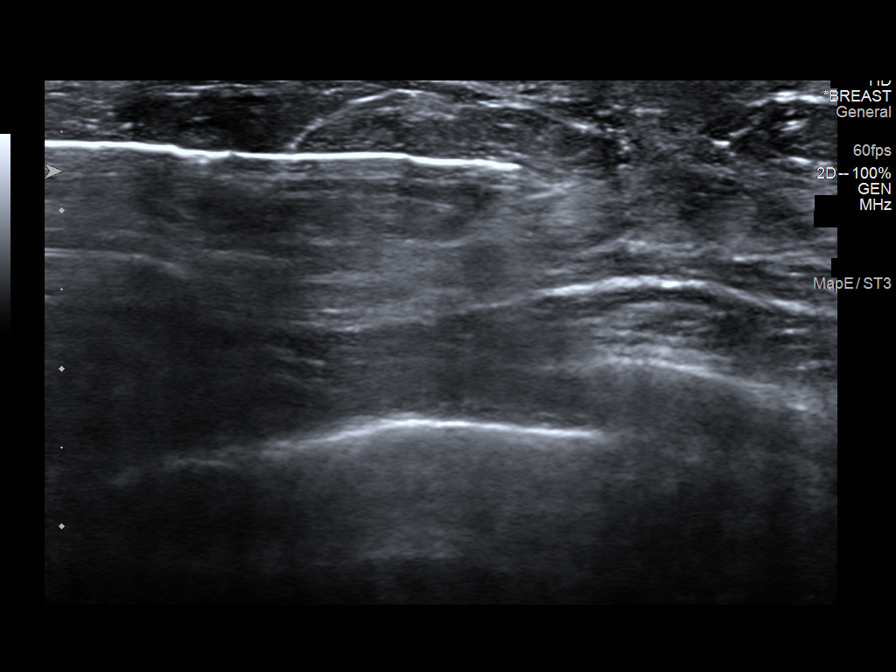
[im 3/8]
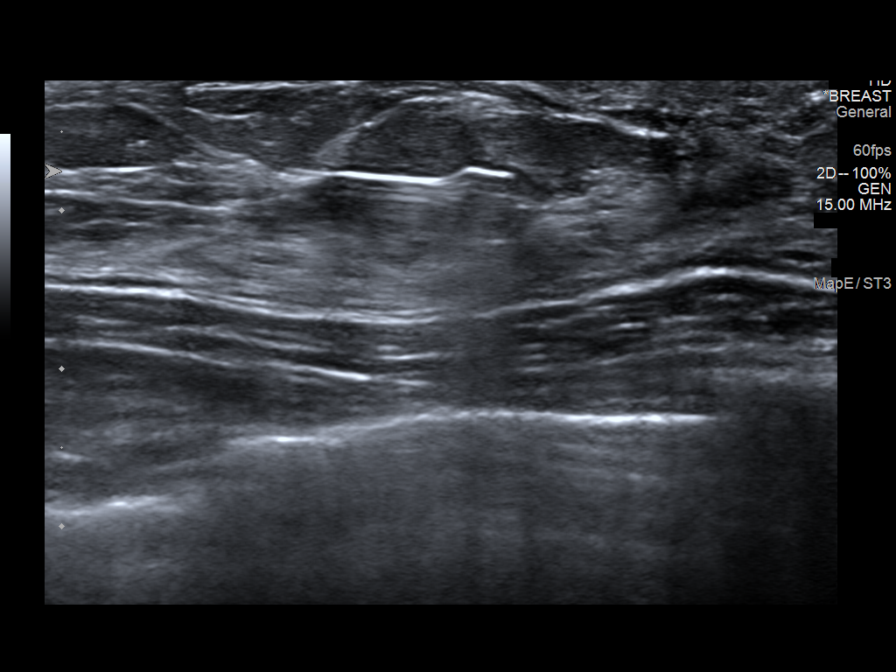
[im 4/8]
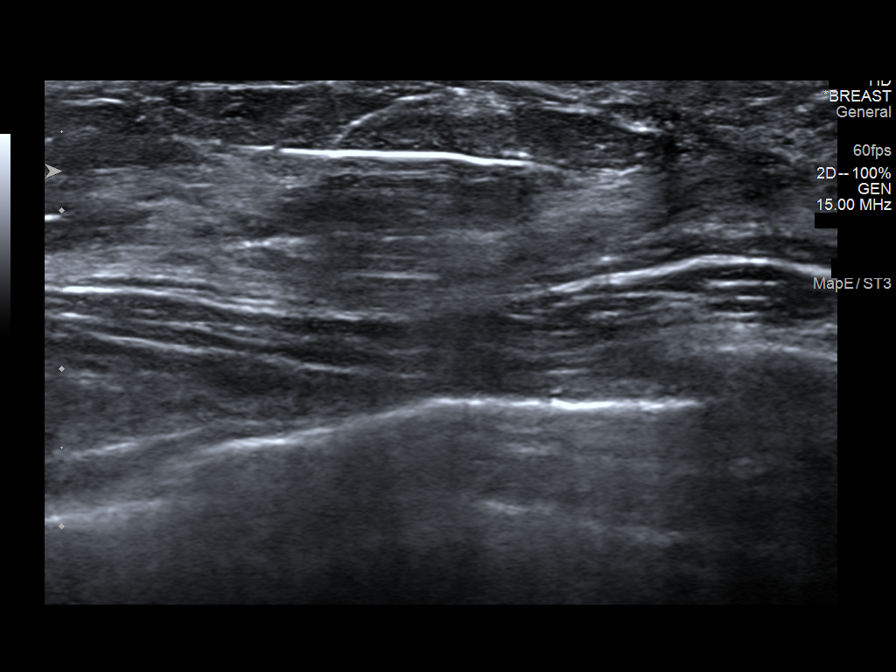
[im 5/8]
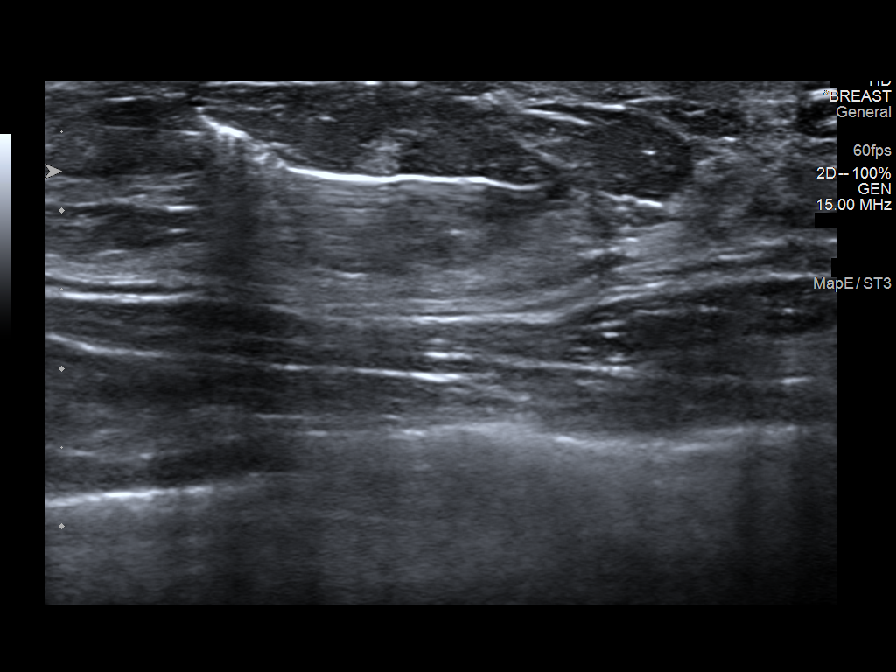
[im 6/8]
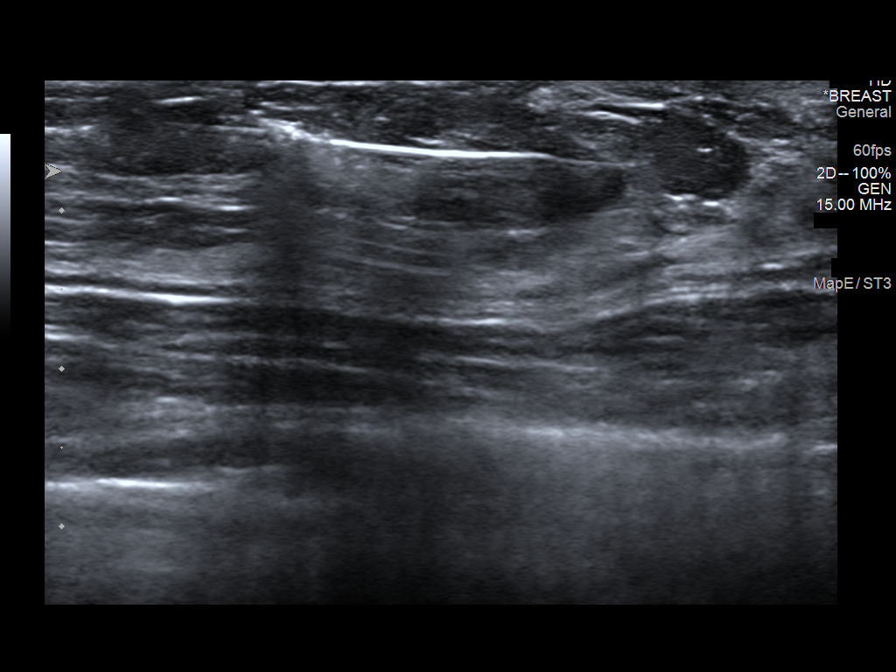
[im 7/8]
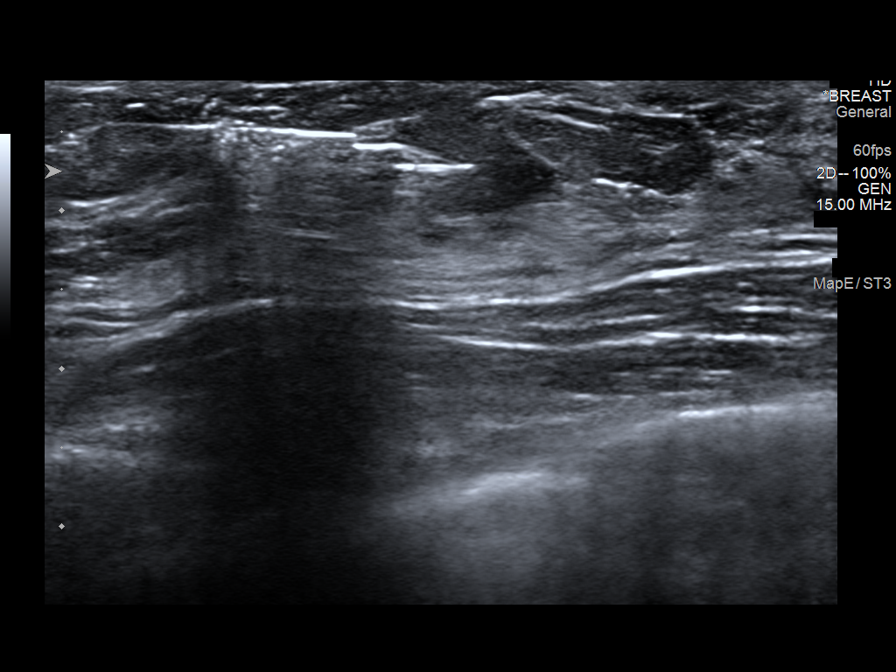
[im 8/8]
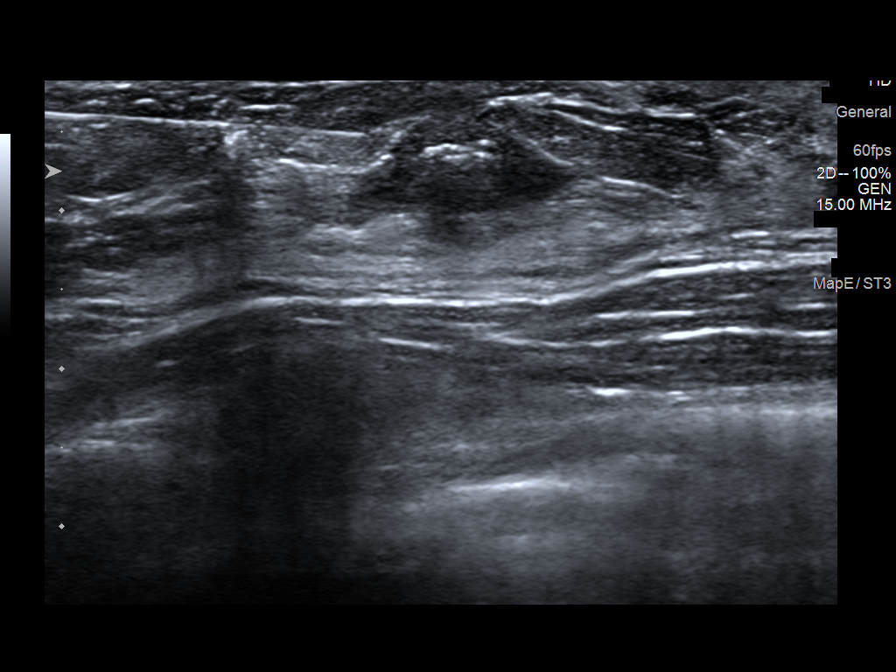

[8 of 8 positions shown; findings below may reference images not displayed]



Lesion quadrant: UPPER INNER LEFT breast

Using sterile technique and 1% Lidocaine as local anesthetic, under
direct ultrasound visualization, a 14 gauge Coreas device was
used to perform biopsy of the 1.5 cm mass at the 10 o'clock position
of the LEFT breast 1 cm from the nipple using a MEDIAL approach. At
the conclusion of the procedure a RIBBON tissue marker clip was
deployed into the biopsy cavity. Follow up 2 view mammogram was
performed and dictated separately.
IMPRESSION: Ultrasound guided biopsy of 1.5 cm UPPER INNER LEFT breast mass. No
apparent complications.

ADDENDUM:
Pathology revealed PAPILLARY LESION of the LEFT breast, 10 o'clock
position, upper inner. The findings are suggestive of an intraductal
papilloma. This was found to be concordant by Dr. Smitha Rowan, with
excision recommended.

Pathology results were discussed with the patient by telephone. The
patient reported doing well after the biopsy with tenderness at the
site. Post biopsy instructions and care were reviewed and questions
were answered. The patient was encouraged to call The [REDACTED]

Surgical consultation has been arranged with Dr. Vaheed Tiger at
[REDACTED] on September 18, 2018.

Pathology results reported by Devante Dancy, RN on 09/10/2018.



Lesion quadrant: UPPER INNER LEFT breast

Using sterile technique and 1% Lidocaine as local anesthetic, under
direct ultrasound visualization, a 14 gauge Coreas device was
used to perform biopsy of the 1.5 cm mass at the 10 o'clock position
of the LEFT breast 1 cm from the nipple using a MEDIAL approach. At
the conclusion of the procedure a RIBBON tissue marker clip was
deployed into the biopsy cavity. Follow up 2 view mammogram was
performed and dictated separately.
IMPRESSION: Ultrasound guided biopsy of 1.5 cm UPPER INNER LEFT breast mass. No
apparent complications.

## 2022-05-09 DIAGNOSIS — L700 Acne vulgaris: Secondary | ICD-10-CM | POA: Diagnosis not present

## 2022-05-09 DIAGNOSIS — Z79899 Other long term (current) drug therapy: Secondary | ICD-10-CM | POA: Diagnosis not present
